# Patient Record
Sex: Female | Born: 1965
Health system: Southern US, Community
[De-identification: ages and names within clinical notes are randomized; demographics above are authoritative.]

## PROBLEM LIST (undated history)

## (undated) DIAGNOSIS — E785 Hyperlipidemia, unspecified: Secondary | ICD-10-CM

## (undated) DIAGNOSIS — E7800 Pure hypercholesterolemia, unspecified: Secondary | ICD-10-CM

## (undated) DIAGNOSIS — K219 Gastro-esophageal reflux disease without esophagitis: Secondary | ICD-10-CM

## (undated) DIAGNOSIS — H209 Unspecified iridocyclitis: Secondary | ICD-10-CM

## (undated) DIAGNOSIS — I10 Essential (primary) hypertension: Secondary | ICD-10-CM

## (undated) DIAGNOSIS — J309 Allergic rhinitis, unspecified: Secondary | ICD-10-CM

## (undated) DIAGNOSIS — M545 Low back pain: Secondary | ICD-10-CM

## (undated) DIAGNOSIS — M1990 Unspecified osteoarthritis, unspecified site: Secondary | ICD-10-CM

## (undated) DIAGNOSIS — D509 Iron deficiency anemia, unspecified: Secondary | ICD-10-CM

## (undated) DIAGNOSIS — Z1589 Genetic susceptibility to other disease: Secondary | ICD-10-CM

## (undated) DIAGNOSIS — D709 Neutropenia, unspecified: Secondary | ICD-10-CM

## (undated) DIAGNOSIS — M47819 Spondylosis without myelopathy or radiculopathy, site unspecified: Secondary | ICD-10-CM

## (undated) HISTORY — DX: Allergic rhinitis, unspecified: J30.9

## (undated) HISTORY — DX: Hyperlipidemia, unspecified: E78.5

## (undated) HISTORY — DX: Spondylosis without myelopathy or radiculopathy, site unspecified: M47.819

## (undated) HISTORY — DX: Low back pain: M54.5

## (undated) HISTORY — DX: Gastro-esophageal reflux disease without esophagitis: K21.9

## (undated) HISTORY — DX: Genetic susceptibility to other disease: Z15.89

## (undated) HISTORY — DX: Unspecified iridocyclitis: H20.9

## (undated) HISTORY — DX: Neutropenia, unspecified: D70.9

---

## 1984-11-03 HISTORY — PX: DILATION AND CURETTAGE OF UTERUS: SHX78

## 1987-11-04 HISTORY — PX: TUMOR REMOVAL: SHX12

## 2002-01-11 ENCOUNTER — Encounter: Admission: RE | Admit: 2002-01-11 | Discharge: 2002-01-11 | Payer: Self-pay | Admitting: Obstetrics and Gynecology

## 2002-01-11 ENCOUNTER — Encounter: Payer: Self-pay | Admitting: Obstetrics and Gynecology

## 2003-01-17 ENCOUNTER — Encounter: Payer: Self-pay | Admitting: Obstetrics and Gynecology

## 2003-01-17 ENCOUNTER — Encounter: Admission: RE | Admit: 2003-01-17 | Discharge: 2003-01-17 | Payer: Self-pay | Admitting: Obstetrics and Gynecology

## 2003-11-27 ENCOUNTER — Emergency Department (HOSPITAL_COMMUNITY): Admission: EM | Admit: 2003-11-27 | Discharge: 2003-11-27 | Payer: Self-pay | Admitting: Emergency Medicine

## 2004-01-31 ENCOUNTER — Encounter: Admission: RE | Admit: 2004-01-31 | Discharge: 2004-01-31 | Payer: Self-pay | Admitting: Family Medicine

## 2004-02-09 ENCOUNTER — Other Ambulatory Visit: Admission: RE | Admit: 2004-02-09 | Discharge: 2004-02-09 | Payer: Self-pay | Admitting: Family Medicine

## 2005-02-05 ENCOUNTER — Encounter: Admission: RE | Admit: 2005-02-05 | Discharge: 2005-02-05 | Payer: Self-pay | Admitting: Family Medicine

## 2005-02-10 ENCOUNTER — Other Ambulatory Visit: Admission: RE | Admit: 2005-02-10 | Discharge: 2005-02-10 | Payer: Self-pay | Admitting: Family Medicine

## 2005-02-25 ENCOUNTER — Emergency Department (HOSPITAL_COMMUNITY): Admission: EM | Admit: 2005-02-25 | Discharge: 2005-02-25 | Payer: Self-pay | Admitting: Emergency Medicine

## 2006-02-10 ENCOUNTER — Other Ambulatory Visit: Admission: RE | Admit: 2006-02-10 | Discharge: 2006-02-10 | Payer: Self-pay | Admitting: Family Medicine

## 2006-02-10 ENCOUNTER — Encounter: Admission: RE | Admit: 2006-02-10 | Discharge: 2006-02-10 | Payer: Self-pay | Admitting: Family Medicine

## 2007-02-15 ENCOUNTER — Other Ambulatory Visit: Admission: RE | Admit: 2007-02-15 | Discharge: 2007-02-15 | Payer: Self-pay | Admitting: Family Medicine

## 2007-02-15 ENCOUNTER — Encounter: Admission: RE | Admit: 2007-02-15 | Discharge: 2007-02-15 | Payer: Self-pay | Admitting: Family Medicine

## 2008-02-16 ENCOUNTER — Other Ambulatory Visit: Admission: RE | Admit: 2008-02-16 | Discharge: 2008-02-16 | Payer: Self-pay | Admitting: Family Medicine

## 2008-02-18 ENCOUNTER — Encounter: Admission: RE | Admit: 2008-02-18 | Discharge: 2008-02-18 | Payer: Self-pay | Admitting: Family Medicine

## 2009-03-13 ENCOUNTER — Emergency Department (HOSPITAL_COMMUNITY): Admission: EM | Admit: 2009-03-13 | Discharge: 2009-03-13 | Payer: Self-pay | Admitting: Emergency Medicine

## 2009-07-04 ENCOUNTER — Other Ambulatory Visit: Admission: RE | Admit: 2009-07-04 | Discharge: 2009-07-04 | Payer: Self-pay | Admitting: Family Medicine

## 2009-07-13 ENCOUNTER — Encounter: Admission: RE | Admit: 2009-07-13 | Discharge: 2009-07-13 | Payer: Self-pay | Admitting: Family Medicine

## 2009-09-22 ENCOUNTER — Emergency Department (HOSPITAL_COMMUNITY): Admission: EM | Admit: 2009-09-22 | Discharge: 2009-09-22 | Payer: Self-pay | Admitting: Emergency Medicine

## 2010-07-05 ENCOUNTER — Other Ambulatory Visit: Admission: RE | Admit: 2010-07-05 | Discharge: 2010-07-05 | Payer: Self-pay | Admitting: Family Medicine

## 2010-09-09 ENCOUNTER — Encounter: Admission: RE | Admit: 2010-09-09 | Discharge: 2010-09-09 | Payer: Self-pay | Admitting: Family Medicine

## 2010-11-23 ENCOUNTER — Emergency Department (HOSPITAL_COMMUNITY)
Admission: EM | Admit: 2010-11-23 | Discharge: 2010-11-23 | Payer: Self-pay | Source: Home / Self Care | Admitting: Family Medicine

## 2011-07-09 ENCOUNTER — Other Ambulatory Visit: Payer: Self-pay | Admitting: Physician Assistant

## 2011-07-09 ENCOUNTER — Other Ambulatory Visit (HOSPITAL_COMMUNITY)
Admission: RE | Admit: 2011-07-09 | Discharge: 2011-07-09 | Disposition: A | Discharge status: Home / Self Care | Payer: 59 | Source: Ambulatory Visit | Attending: Family Medicine | Admitting: Family Medicine

## 2011-07-09 DIAGNOSIS — Z124 Encounter for screening for malignant neoplasm of cervix: Secondary | ICD-10-CM | POA: Insufficient documentation

## 2011-10-15 ENCOUNTER — Other Ambulatory Visit: Payer: Self-pay | Admitting: Family Medicine

## 2011-10-15 DIAGNOSIS — Z1231 Encounter for screening mammogram for malignant neoplasm of breast: Secondary | ICD-10-CM

## 2011-11-11 ENCOUNTER — Ambulatory Visit: Payer: 59

## 2011-11-21 ENCOUNTER — Ambulatory Visit: Payer: 59

## 2011-12-05 ENCOUNTER — Ambulatory Visit
Admission: RE | Admit: 2011-12-05 | Discharge: 2011-12-05 | Disposition: A | Discharge status: Home / Self Care | Payer: 59 | Source: Ambulatory Visit | Attending: Family Medicine | Admitting: Family Medicine

## 2011-12-05 DIAGNOSIS — Z1231 Encounter for screening mammogram for malignant neoplasm of breast: Secondary | ICD-10-CM

## 2012-01-31 ENCOUNTER — Emergency Department (HOSPITAL_COMMUNITY)
Admission: EM | Admit: 2012-01-31 | Discharge: 2012-01-31 | Disposition: A | Discharge status: Home / Self Care | Payer: 59 | Source: Home / Self Care | Attending: Family Medicine | Admitting: Family Medicine

## 2012-01-31 ENCOUNTER — Encounter (HOSPITAL_COMMUNITY): Payer: Self-pay | Admitting: *Deleted

## 2012-01-31 DIAGNOSIS — J302 Other seasonal allergic rhinitis: Secondary | ICD-10-CM

## 2012-01-31 DIAGNOSIS — J309 Allergic rhinitis, unspecified: Secondary | ICD-10-CM

## 2012-01-31 HISTORY — DX: Unspecified osteoarthritis, unspecified site: M19.90

## 2012-01-31 HISTORY — DX: Pure hypercholesterolemia, unspecified: E78.00

## 2012-01-31 MED ORDER — METHYLPREDNISOLONE ACETATE 80 MG/ML IJ SUSP
INTRAMUSCULAR | Status: AC
  Filled 2012-01-31: qty 1

## 2012-01-31 MED ORDER — METHYLPREDNISOLONE ACETATE 40 MG/ML IJ SUSP
80.0000 mg | Freq: Once | INTRAMUSCULAR | Status: AC
  Administered 2012-01-31: 80 mg via INTRAMUSCULAR

## 2012-01-31 MED ORDER — FLUTICASONE PROPIONATE 50 MCG/ACT NA SUSP: 2.0000 | Freq: Every day | NASAL | Status: DC

## 2012-01-31 MED ORDER — CETIRIZINE HCL 10 MG PO TABS: 10.0000 mg | ORAL_TABLET | Freq: Every day | ORAL | Status: DC

## 2012-01-31 NOTE — ED Notes (Signed)
Pt with c/o cough/congestion/headache/sorethroat/chills onset Sunday - called own md Tuesday told to treat as virus - no improvement

## 2012-01-31 NOTE — ED Provider Notes (Signed)
History     CSN: [id number]  Arrival date & time 01/31/12  1026   First MD Initiated Contact with Patient 01/31/12 1029      Chief Complaint  Patient presents with  . Sore Throat  . Headache  . Chills  . Nasal Congestion  . Cough    (Consider location/radiation/quality/duration/timing/severity/associated sxs/prior treatment) Patient is a 46 y.o. female presenting with pharyngitis. The history is provided by the patient.  Sore Throat This is a new problem. The current episode started more than 1 week ago. The problem has not changed since onset.The symptoms are aggravated by swallowing.    Past Medical History  Diagnosis Date  . High cholesterol   . Arthritis     Past Surgical History  Procedure Date  . Cesarean section     History reviewed. No pertinent family history.  History  Substance Use Topics  . Smoking status: Never Smoker   . Smokeless tobacco: Not on file  . Alcohol Use: No    OB History    Grav Para Term Preterm Abortions TAB SAB Ect Mult Living                  Review of Systems  Constitutional: Negative.   HENT: Positive for congestion, sore throat, rhinorrhea, sneezing and postnasal drip.   Respiratory: Negative for cough and wheezing.   Gastrointestinal: Negative.     Allergies  Review of patient's allergies indicates no known allergies.  Home Medications   Current Outpatient Rx  Name Route Sig Dispense Refill  . ATORVASTATIN CALCIUM 20 MG PO TABS Oral Take 20 mg by mouth daily.    Marland Kitchen CALCIUM CARBONATE 200 MG PO CAPS Oral Take 250 mg by mouth 2 (two) times daily with a meal.    . GUAIFENESIN ER 600 MG PO TB12 Oral Take 1,200 mg by mouth 2 (two) times daily.    Marland Kitchen ONE-DAILY MULTI VITAMINS PO TABS Oral Take 1 tablet by mouth daily.    Marland Kitchen PRESCRIPTION MEDICATION  Lo oval    . CETIRIZINE HCL 10 MG PO TABS Oral Take 1 tablet (10 mg total) by mouth daily. One tab daily for allergies 30 tablet 1  . FLUTICASONE PROPIONATE 50 MCG/ACT NA SUSP  Nasal Place 2 sprays into the nose daily. 1 g 2    BP 147/91  Pulse 78  Temp(Src) 99.4 F (37.4 C) (Oral)  Resp 18  SpO2 98%  LMP 01/10/2012  Physical Exam  Nursing note and vitals reviewed. Constitutional: She is oriented to person, place, and time. She appears well-developed and well-nourished.  HENT:  Head: Normocephalic.  Right Ear: External ear normal.  Left Ear: External ear normal.  Nose: Mucosal edema and rhinorrhea present.  Mouth/Throat: Oropharynx is clear and moist.  Eyes: Conjunctivae are normal. Pupils are equal, round, and reactive to light.  Neck: Normal range of motion. Neck supple.  Cardiovascular: Normal rate and normal heart sounds.   Pulmonary/Chest: Breath sounds normal. She has no wheezes. She has no rales.  Lymphadenopathy:    She has no cervical adenopathy.  Neurological: She is alert and oriented to person, place, and time.  Skin: Skin is warm and dry.    ED Course  Procedures (including critical care time)  Labs Reviewed - No data to display No results found.   1. Seasonal allergic rhinitis       MDM          Linna Hoff, MD 02/02/12 1530

## 2012-07-13 ENCOUNTER — Other Ambulatory Visit (HOSPITAL_COMMUNITY)
Admission: RE | Admit: 2012-07-13 | Discharge: 2012-07-13 | Disposition: A | Discharge status: Home / Self Care | Payer: 59 | Source: Ambulatory Visit | Attending: Family Medicine | Admitting: Family Medicine

## 2012-07-13 ENCOUNTER — Other Ambulatory Visit: Payer: Self-pay | Admitting: Physician Assistant

## 2012-07-13 DIAGNOSIS — Z124 Encounter for screening for malignant neoplasm of cervix: Secondary | ICD-10-CM | POA: Insufficient documentation

## 2013-01-10 ENCOUNTER — Other Ambulatory Visit: Payer: Self-pay

## 2013-01-10 ENCOUNTER — Other Ambulatory Visit: Payer: Self-pay | Admitting: Family Medicine

## 2013-01-10 DIAGNOSIS — Z1231 Encounter for screening mammogram for malignant neoplasm of breast: Secondary | ICD-10-CM

## 2013-01-17 ENCOUNTER — Ambulatory Visit: Payer: 59

## 2013-02-08 ENCOUNTER — Encounter (HOSPITAL_COMMUNITY): Payer: Self-pay

## 2013-02-08 ENCOUNTER — Emergency Department (HOSPITAL_COMMUNITY)
Admission: EM | Admit: 2013-02-08 | Discharge: 2013-02-08 | Disposition: A | Discharge status: Home / Self Care | Payer: 59 | Attending: Emergency Medicine | Admitting: Emergency Medicine

## 2013-02-08 DIAGNOSIS — Z79899 Other long term (current) drug therapy: Secondary | ICD-10-CM | POA: Insufficient documentation

## 2013-02-08 DIAGNOSIS — M545 Low back pain, unspecified: Secondary | ICD-10-CM | POA: Insufficient documentation

## 2013-02-08 DIAGNOSIS — R110 Nausea: Secondary | ICD-10-CM | POA: Insufficient documentation

## 2013-02-08 DIAGNOSIS — E7800 Pure hypercholesterolemia, unspecified: Secondary | ICD-10-CM | POA: Insufficient documentation

## 2013-02-08 DIAGNOSIS — R109 Unspecified abdominal pain: Secondary | ICD-10-CM | POA: Insufficient documentation

## 2013-02-08 DIAGNOSIS — Z3202 Encounter for pregnancy test, result negative: Secondary | ICD-10-CM | POA: Insufficient documentation

## 2013-02-08 DIAGNOSIS — M129 Arthropathy, unspecified: Secondary | ICD-10-CM | POA: Insufficient documentation

## 2013-02-08 DIAGNOSIS — R10819 Abdominal tenderness, unspecified site: Secondary | ICD-10-CM | POA: Insufficient documentation

## 2013-02-08 DIAGNOSIS — IMO0002 Reserved for concepts with insufficient information to code with codable children: Secondary | ICD-10-CM | POA: Insufficient documentation

## 2013-02-08 LAB — LIPASE, BLOOD: Lipase: 34 U/L (ref 11–59)

## 2013-02-08 LAB — CBC WITH DIFFERENTIAL/PLATELET
Eosinophils Absolute: 0.1 10*3/uL (ref 0.0–0.7)
Eosinophils Relative: 1 % (ref 0–5)
HCT: 37.1 % (ref 36.0–46.0)
Hemoglobin: 12.1 g/dL (ref 12.0–15.0)
Lymphocytes Relative: 12 % (ref 12–46)
Lymphs Abs: 0.5 10*3/uL — ABNORMAL LOW (ref 0.7–4.0)
MCHC: 32.6 g/dL (ref 30.0–36.0)
Monocytes Relative: 13 % — ABNORMAL HIGH (ref 3–12)
Neutro Abs: 3 10*3/uL (ref 1.7–7.7)
WBC: 4.1 10*3/uL (ref 4.0–10.5)

## 2013-02-08 LAB — COMPREHENSIVE METABOLIC PANEL
ALT: 17 U/L (ref 0–35)
AST: 25 U/L (ref 0–37)
BUN: 11 mg/dL (ref 6–23)
Calcium: 9.3 mg/dL (ref 8.4–10.5)
Chloride: 104 mEq/L (ref 96–112)
GFR calc Af Amer: >90 mL/min (ref >90)
GFR calc non Af Amer: >90 mL/min (ref >90)
Total Bilirubin: 0.3 mg/dL (ref 0.3–1.2)

## 2013-02-08 LAB — URINALYSIS, ROUTINE W REFLEX MICROSCOPIC
Bilirubin Urine: NEGATIVE
Hgb urine dipstick: NEGATIVE
Protein, ur: NEGATIVE mg/dL
Urobilinogen, UA: 1 mg/dL (ref 0.0–1.0)

## 2013-02-08 LAB — POCT PREGNANCY, URINE: Preg Test, Ur: NEGATIVE

## 2013-02-08 MED ORDER — NAPROXEN 500 MG PO TABS: 500.0000 mg | ORAL_TABLET | Freq: Two times a day (BID) | ORAL | Status: DC

## 2013-02-08 MED ORDER — SODIUM CHLORIDE 0.9 % IV SOLN
Freq: Once | INTRAVENOUS | Status: AC
  Administered 2013-02-08: 08:00:00 via INTRAVENOUS

## 2013-02-08 MED ORDER — ONDANSETRON HCL 4 MG/2ML IJ SOLN
4.0000 mg | Freq: Four times a day (QID) | INTRAMUSCULAR | Status: DC | PRN
  Administered 2013-02-08: 4 mg via INTRAVENOUS
  Filled 2013-02-08: qty 2

## 2013-02-08 MED ORDER — TRAMADOL HCL 50 MG PO TABS: 50.0000 mg | ORAL_TABLET | Freq: Four times a day (QID) | ORAL | Status: DC | PRN

## 2013-02-08 MED ORDER — MORPHINE SULFATE 4 MG/ML IJ SOLN
4.0000 mg | INTRAMUSCULAR | Status: DC | PRN
Start: 2013-02-08 — End: 2013-02-08
  Administered 2013-02-08: 4 mg via INTRAVENOUS
  Filled 2013-02-08: qty 1

## 2013-02-08 NOTE — ED Notes (Signed)
Pt complains of lower abd pain since yesterday and nausea, no vomiting, no diarrhea.

## 2013-02-08 NOTE — ED Notes (Signed)
Pt escorted to discharge window. Pt verbalized understanding discharge instructions. In no acute distress.  

## 2013-02-08 NOTE — ED Notes (Signed)
md at bedside

## 2013-02-08 NOTE — ED Provider Notes (Signed)
History     CSN: [id number]  Arrival date & time 02/08/13  4098   First MD Initiated Contact with Patient 02/08/13 [phone]      Chief Complaint  Patient presents with  . Abdominal Pain  . 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 She denies any diarrhea, rectal bleeding, vomiting, cough or shortness of breath but she does have mild low back pain which has persisted over several years. The pain is located in the suprapubic area but seems to be diffuse in the mid abdomen.  She has a Mirena IUD, has recently visited with OB/GYN and had ultrasound showing an ovarian cyst which  has since resolved  Patient is a 47 y.o. female presenting with abdominal pain. The history is provided by the patient.  Abdominal Pain   Past Medical History  Diagnosis Date  . High cholesterol   . Arthritis     Past Surgical History  Procedure Laterality Date  . Cesarean section      History reviewed. No pertinent family history.  History  Substance Use Topics  . Smoking status: Never Smoker   . Smokeless tobacco: Not on file  . Alcohol Use: No    OB History   Grav Para Term Preterm Abortions TAB SAB Ect Mult Living                  Review of Systems  Gastrointestinal: Positive for abdominal pain.  All other systems reviewed and are negative.    Allergies  Contrast media  Home Medications   Current Outpatient Rx  Name  Route  Sig  Dispense  Refill  . atorvastatin (LIPITOR) 40 MG tablet   Oral   Take 40 mg by mouth every evening.         . calcium carbonate (OS-CAL - DOSED IN MG OF ELEMENTAL  CALCIUM) 1250 MG tablet   Oral   Take 1 tablet by mouth daily.         . diclofenac (VOLTAREN) 75 MG EC tablet   Oral   Take 75 mg by mouth 2 (two) times daily.         . fluticasone (FLONASE) 50 MCG/ACT nasal spray   Nasal   Place 2 sprays into the nose daily.         . Multiple Vitamin (MULTIVITAMIN) tablet   Oral   Take 1 tablet by mouth daily.         . naproxen (NAPROSYN) 500 MG tablet   Oral   Take 1 tablet (500 mg total) by mouth 2 (two) times daily with a meal.   30 tablet   0   . traMADol (ULTRAM) 50 MG tablet   Oral   Take 1 tablet (50 mg total) by mouth every 6 (six) hours as needed for pain.   15 tablet   0     BP 106/69  Pulse 62  Temp(Src) 98.1 F (36.7 C) (Oral)  Resp 16  Wt 220 lb (99.791 kg)  SpO2 97%  LMP 02/03/2013  Physical Exam  Nursing note and vitals reviewed. Constitutional: She appears well-developed and well-nourished. No distress.  HENT:  Head: Normocephalic and  atraumatic.  Mouth/Throat: Oropharynx is clear and moist. No oropharyngeal exudate.  Eyes: Conjunctivae and EOM are normal. Pupils are equal, round, and reactive to light. Right eye exhibits no discharge. Left eye exhibits no discharge. No scleral icterus.  Neck: Normal range of motion. Neck supple. No JVD present. No thyromegaly present.  Cardiovascular: Normal rate, regular rhythm, normal heart sounds and intact distal pulses.  Exam reveals no gallop and no friction rub.   No murmur heard. Pulmonary/Chest: Effort normal and breath sounds normal. No respiratory distress. She has no wheezes. She has no rales.  Abdominal: Soft. Bowel sounds are normal. She exhibits no distension and no mass. There is tenderness ( Mild tenderness in the suprapubic area, periumbilical area and epigastrium).  No pain in the right upper quadrant, no pain at McBurney's point, no guarding, no peritoneal signs, no tympanitic sounds to percussion  Musculoskeletal: Normal range of motion. She  exhibits no edema and no tenderness.  Lymphadenopathy:    She has no cervical adenopathy.  Neurological: She is alert. Coordination normal.  Speech is clear, moves all extremities x4 without difficulty  Skin: Skin is warm and dry. No rash noted. No erythema.  Psychiatric: She has a normal mood and affect. Her behavior is normal.    ED Course  Procedures (including critical care time)  Labs Reviewed  URINALYSIS, ROUTINE W REFLEX MICROSCOPIC - Abnormal; Notable for the following:    APPearance CLOUDY (*)    Leukocytes, UA SMALL (*)    All other components within normal limits  CBC WITH DIFFERENTIAL - Abnormal; Notable for the following:    MCH 25.7 (*)    Lymphs Abs 0.5 (*)    Monocytes Relative 13 (*)    All other components within normal limits  COMPREHENSIVE METABOLIC PANEL - Abnormal; Notable for the following:    Glucose, Bld 103 (*)    Albumin 3.4 (*)    All other components within normal limits  URINE MICROSCOPIC-ADD ON - Abnormal; Notable for the following:    Squamous Epithelial / LPF MANY (*)    Bacteria, UA MANY (*)    All other components within normal limits  URINE CULTURE  LIPASE, BLOOD  POCT PREGNANCY, URINE   No results found.   1. Abdominal pain       MDM  Nonspecific abdominal pain, no pain at McBurney's point or right upper quadrant, no vomiting, check urinalysis, basic labs, would consider several different etiologies but nothing specific on initial history and exam. Vital signs unremarkable, no hypotension, no fever, no tachycardia. Patient declines pain medicines or nausea medicines at this time and symptoms are absent on arrival.   Repeat abdominal exam at 9:30 AM, no abdominal tenderness, the patient states she has improved and has no symptoms at this time, lab work shows no significant etiology of the patient's symptoms, urine culture ordered as it appears contaminated. Patient informed to return should her symptoms worsen, expressed  understanding.  Meds given in ED:  Medications  morphine 4 MG/ML injection 4 mg (4 mg Intravenous Given 02/08/13 0800)  ondansetron (ZOFRAN) injection 4 mg (4 mg Intravenous Given 02/08/13 0801)  0.9 %  sodium chloride infusion ( Intravenous New Bag/Given 02/08/13 0800)    New Prescriptions   NAPROXEN (NAPROSYN) 500 MG TABLET    Take 1 tablet (500 mg total) by mouth 2 (two) times daily with a meal.   TRAMADOL (ULTRAM) 50 MG TABLET    Take 1 tablet (50 mg total) by mouth every 6 (  six) hours as needed for pain.         Vida Roller, MD 02/08/13 [phone]

## 2013-02-09 LAB — URINE CULTURE
Colony Count: NO GROWTH
Culture: NO GROWTH

## 2013-04-18 ENCOUNTER — Ambulatory Visit
Admission: RE | Admit: 2013-04-18 | Discharge: 2013-04-18 | Disposition: A | Discharge status: Home / Self Care | Payer: 59 | Source: Ambulatory Visit

## 2013-04-18 DIAGNOSIS — Z1231 Encounter for screening mammogram for malignant neoplasm of breast: Secondary | ICD-10-CM

## 2013-11-07 ENCOUNTER — Encounter (HOSPITAL_COMMUNITY): Payer: Self-pay | Admitting: Emergency Medicine

## 2013-11-07 ENCOUNTER — Emergency Department (HOSPITAL_COMMUNITY)
Admission: EM | Admit: 2013-11-07 | Discharge: 2013-11-07 | Disposition: A | Discharge status: Home / Self Care | Payer: 59 | Source: Home / Self Care | Attending: Family Medicine | Admitting: Family Medicine

## 2013-11-07 DIAGNOSIS — J329 Chronic sinusitis, unspecified: Secondary | ICD-10-CM

## 2013-11-07 MED ORDER — PREDNISONE 10 MG PO TABS: 30.0000 mg | ORAL_TABLET | Freq: Every day | ORAL | Status: DC

## 2013-11-07 MED ORDER — AMOXICILLIN-POT CLAVULANATE 875-125 MG PO TABS: 1.0000 | ORAL_TABLET | Freq: Two times a day (BID) | ORAL | Status: DC

## 2013-11-07 MED ORDER — IPRATROPIUM BROMIDE 0.06 % NA SOLN: 2.0000 | Freq: Four times a day (QID) | NASAL | Status: DC

## 2013-11-07 NOTE — Discharge Instructions (Signed)
Thank you for coming in today. 8 Augmentin twice daily for one week. Take prednisone for 5 days. Use Atrovent nasal spray. Followup with your primary care Dr. as needed. Call or go to the emergency room if you get worse, have trouble breathing, have chest pains, or palpitations.   Sinusitis Sinusitis is redness, soreness, and swelling (inflammation) of the paranasal sinuses. Paranasal sinuses are air pockets within the bones of your face (beneath the eyes, the middle of the forehead, or above the eyes). In healthy paranasal sinuses, mucus is able to drain out, and air is able to circulate through them by way of your nose. However, when your paranasal sinuses are inflamed, mucus and air can become trapped. This can allow bacteria and other germs to grow and cause infection. Sinusitis can develop quickly and last only a short time (acute) or continue over a long period (chronic). Sinusitis that lasts for more than 12 weeks is considered chronic.  CAUSES  Causes of sinusitis include:  Allergies.  Structural abnormalities, such as displacement of the cartilage that separates your nostrils (deviated septum), which can decrease the air flow through your nose and sinuses and affect sinus drainage.  Functional abnormalities, such as when the small hairs (cilia) that line your sinuses and help remove mucus do not work properly or are not present. SYMPTOMS  Symptoms of acute and chronic sinusitis are the same. The primary symptoms are pain and pressure around the affected sinuses. Other symptoms include:  Upper toothache.  Earache.  Headache.  Bad breath.  Decreased sense of smell and taste.  A cough, which worsens when you are lying flat.  Fatigue.  Fever.  Thick drainage from your nose, which often is green and may contain pus (purulent).  Swelling and warmth over the affected sinuses. DIAGNOSIS  Your caregiver will perform a physical exam. During the exam, your caregiver  may:  Look in your nose for signs of abnormal growths in your nostrils (nasal polyps).  Tap over the affected sinus to check for signs of infection.  View the inside of your sinuses (endoscopy) with a special imaging device with a light attached (endoscope), which is inserted into your sinuses. If your caregiver suspects that you have chronic sinusitis, one or more of the following tests may be recommended:  Allergy tests.  Nasal culture A sample of mucus is taken from your nose and sent to a lab and screened for bacteria.  Nasal cytology A sample of mucus is taken from your nose and examined by your caregiver to determine if your sinusitis is related to an allergy. TREATMENT  Most cases of acute sinusitis are related to a viral infection and will resolve on their own within 10 days. Sometimes medicines are prescribed to help relieve symptoms (pain medicine, decongestants, nasal steroid sprays, or saline sprays).  However, for sinusitis related to a bacterial infection, your caregiver will prescribe antibiotic medicines. These are medicines that will help kill the bacteria causing the infection.  Rarely, sinusitis is caused by a fungal infection. In theses cases, your caregiver will prescribe antifungal medicine. For some cases of chronic sinusitis, surgery is needed. Generally, these are cases in which sinusitis recurs more than 3 times per year, despite other treatments. HOME CARE INSTRUCTIONS   Drink plenty of water. Water helps thin the mucus so your sinuses can drain more easily.  Use a humidifier.  Inhale steam 3 to 4 times a day (for example, sit in the bathroom with the shower running).  Apply  a warm, moist washcloth to your face 3 to 4 times a day, or as directed by your caregiver.  Use saline nasal sprays to help moisten and clean your sinuses.  Take over-the-counter or prescription medicines for pain, discomfort, or fever only as directed by your caregiver. SEEK IMMEDIATE  MEDICAL CARE IF:  You have increasing pain or severe headaches.  You have nausea, vomiting, or drowsiness.  You have swelling around your face.  You have vision problems.  You have a stiff neck.  You have difficulty breathing. MAKE SURE YOU:   Understand these instructions.  Will watch your condition.  Will get help right away if you are not doing well or get worse. Document Released: 10/20/2005 Document Revised: 01/12/2012 Document Reviewed: 11/04/2011 [facility] Patient Information 2014 [place], [place].

## 2013-11-07 NOTE — ED Notes (Signed)
Congestion and headache that started on November 03, 2013.  -patient has been taking mucinex d and aleve.

## 2013-11-07 NOTE — ED Provider Notes (Signed)
Jennifer Webb is a 48 y.o. female who presents to Urgent Care today for 5 days of sinus congestion headache cough without any significant fevers chills nausea vomiting or diarrhea. Patient has tried some over-the-counter Mucinex D which helped a bit. She feels well otherwise. She has frequent episodes of sinusitis. Symptoms are moderate and worse with activity.   Past Medical History  Diagnosis Date  . High cholesterol   . Arthritis    History  Substance Use Topics  . Smoking status: Never Smoker   . Smokeless tobacco: Not on file  . Alcohol Use: No   ROS as above Medications reviewed. No current facility-administered medications for this encounter.   Current Outpatient Prescriptions  Medication Sig Dispense Refill  . amoxicillin-clavulanate (AUGMENTIN) 875-125 MG per tablet Take 1 tablet by mouth every 12 (twelve) hours.  14 tablet  0  . atorvastatin (LIPITOR) 40 MG tablet Take 40 mg by mouth every evening.      . calcium carbonate (OS-CAL - DOSED IN MG OF ELEMENTAL CALCIUM) 1250 MG tablet Take 1 tablet by mouth daily.      . diclofenac (VOLTAREN) 75 MG EC tablet Take 75 mg by mouth 2 (two) times daily.      . fluticasone (FLONASE) 50 MCG/ACT nasal spray Place 2 sprays into the nose daily.      Marland Kitchen ipratropium (ATROVENT) 0.06 % nasal spray Place 2 sprays into both nostrils 4 (four) times daily.  15 mL  1  . Multiple Vitamin (MULTIVITAMIN) tablet Take 1 tablet by mouth daily.      . naproxen (NAPROSYN) 500 MG tablet Take 1 tablet (500 mg total) by mouth 2 (two) times daily with a meal.  30 tablet  0  . predniSONE (DELTASONE) 10 MG tablet Take 3 tablets (30 mg total) by mouth daily.  15 tablet  0  . traMADol (ULTRAM) 50 MG tablet Take 1 tablet (50 mg total) by mouth every 6 (six) hours as needed for pain.  15 tablet  0    Exam:  BP 138/94  Pulse 72  Temp(Src) 98.1 F (36.7 C) (Oral)  Resp 16  SpO2 98%  LMP 10/23/2013 Gen: Well NAD HEENT: EOMI,  MMM mildly tender right  maxillary sinus to palpation. Tympanic membranes are normal appearing bilaterally. Posterior pharynx is with cobblestoning. Nasal turbinates are mildly inflamed bilaterally Lungs: Normal work of breathing. CTABL Heart: RRR no MRG Abd: NABS, Soft. NT, ND Exts: Non edematous BL  LE, warm and well perfused.    Assessment and Plan: 48 y.o. female with sinusitis. Plan to treat with low-dose prednisone, Augmentin, Atrovent nasal spray. Followup with primary care provider. Discussed warning signs or symptoms. Please see discharge instructions. Patient expresses understanding.      Rodolph Bong, MD 11/07/13 [phone]

## 2013-12-12 ENCOUNTER — Ambulatory Visit (INDEPENDENT_AMBULATORY_CARE_PROVIDER_SITE_OTHER): Payer: 59 | Admitting: Gynecology

## 2013-12-12 ENCOUNTER — Encounter: Payer: Self-pay | Admitting: Gynecology

## 2013-12-12 VITALS — BP 126/86 | HR 72 | Resp 16 | Ht 65.5 in | Wt 217.0 lb

## 2013-12-12 DIAGNOSIS — Z0000 Encounter for general adult medical examination without abnormal findings: Secondary | ICD-10-CM

## 2013-12-12 DIAGNOSIS — N898 Other specified noninflammatory disorders of vagina: Secondary | ICD-10-CM

## 2013-12-12 DIAGNOSIS — Z3009 Encounter for other general counseling and advice on contraception: Secondary | ICD-10-CM

## 2013-12-12 DIAGNOSIS — Z01419 Encounter for gynecological examination (general) (routine) without abnormal findings: Secondary | ICD-10-CM

## 2013-12-12 LAB — POCT URINALYSIS DIPSTICK
Leukocytes, UA: NEGATIVE
UROBILINOGEN UA: NEGATIVE
pH, UA: 5

## 2013-12-12 LAB — POCT WET PREP (WET MOUNT): CLUE CELLS WET PREP WHIFF POC: POSITIVE

## 2013-12-12 MED ORDER — TINIDAZOLE 500 MG PO TABS: 2.0000 g | ORAL_TABLET | Freq: Once | ORAL | Status: DC

## 2013-12-12 NOTE — Progress Notes (Signed)
48 y.o. divorce African American female   G2P2001 here for annual exam. Pt is currently sexually active. Pt has Mirena IUD placed 11/13,  Pt does get some form of menses on IUD, light flow for 3d usually.  Pt reports having a discharge for a while-fishy odors, yellowish, can be worse after sex.  Pt was treated for BV a few months back and did get better.  No douching. Pads only, no tampons.  Patient's last menstrual period was 11/21/2013.          Sexually active: yes  The current method of family planning is IUD.    Exercising: yes  [employer] 3x/wk  Last pap: 07/2012 Negative  Alcohol:  Occasionally  Tobacco: no BSE: yes Mammogram: 04/2013 Normal  Labs: Estill Batten  Urine: Negative     No health maintenance topics applied.  Family History  Problem Relation Age of Onset  . Diabetes Mother   . Hypertension Mother   . Lung cancer Father   . Hypertension Father   . Heart attack Maternal Aunt   . Diabetes Maternal Grandmother   . Heart attack Maternal Grandmother   . Pancreatic cancer Brother     There are no active problems to display for this patient.   Past Medical History  Diagnosis Date  . High cholesterol   . Arthritis     Past Surgical History  Procedure Laterality Date  . Cesarean section      x2  . 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 Dilation and curettage of uterus  1986    Allergies: Contrast media  Current Outpatient Prescriptions  Medication Sig Dispense Refill  . calcium carbonate (OS-CAL - DOSED IN MG OF ELEMENTAL CALCIUM) 1250 MG tablet Take 1 tablet by mouth daily.      . diclofenac (VOLTAREN) 75 MG EC tablet Take 75 mg by mouth 2 (two) times daily.      . fluticasone (FLONASE) 50 MCG/ACT nasal spray Place 2 sprays into the nose daily.      . Multiple Vitamin (MULTIVITAMIN) tablet Take 1 tablet by mouth daily.      Marland Kitchen atorvastatin (LIPITOR) 40 MG tablet Take 40 mg by mouth every evening.       No current  facility-administered medications for this visit.    ROS: Pertinent items are noted in HPI.  Exam:    BP 126/86  Pulse 72  Resp 16  Ht 5' 5.5" (1.664 m)  Wt 217 lb (98.431 kg)  BMI 35.55 kg/m2  LMP 11/21/2013 Weight change: @WEIGHTCHANGE @ Last 3 height recordings:  Ht Readings from Last 3 Encounters:  12/12/13 5' 5.5" (1.664 m)   General appearance: alert, cooperative and appears stated age Head: Normocephalic, without obvious abnormality, atraumatic Neck: no adenopathy, no carotid bruit, no JVD, supple, symmetrical, trachea midline and thyroid not enlarged, symmetric, no tenderness/mass/nodules Lungs: clear to auscultation bilaterally Breasts: normal appearance, no masses or tenderness Heart: regular rate and rhythm, S1, S2 normal, no murmur, click, rub or gallop Abdomen: soft, non-tender; bowel sounds normal; no masses,  no organomegaly Extremities: extremities normal, atraumatic, no cyanosis or edema Skin: Skin color, texture, turgor normal. No rashes or lesions Lymph nodes: Cervical, supraclavicular, and axillary nodes normal. no inguinal nodes palpated Neurologic: Grossly normal   Pelvic: External genitalia:  no lesions              Urethra: normal appearing urethra with no masses, tenderness or lesions  Bartholins and Skenes: normal                 Vagina: normal appearing vagina with normal color and discharge, no lesions, vaginal discharge - white and thin              Cervix: normal appearance and os stenotic              Pap taken: no        Bimanual Exam:  Uterus:  uterus is normal size, shape, consistency and nontender                                      Adnexa:    normal adnexa in size, nontender and no masses                                      Rectovaginal: Confirms                                      Anus:  normal sphincter tone, no lesions  A: well woman Contraceptive management with IUD Vaginal discharge     P: mammogram annually pap  smear not done-reviewed neg PAP 2010-2013, new guidelines reviewed, repeat next year, HPV not done Discussed contraceptive options-pt had been on ocp but issues with compliance.  Risks and benefits of IUD reviewed, pt assured re light flow, now she choses to keep IUD, will need cytotec IUD strings not noted, pt reports having u/s to confirm placement tindamax for BV, alcohol precautions counseled on breast self exam, mammography screening, adequate intake of calcium and vitamin D, diet and exercise return annually or prn   An After Visit Summary was printed and given to the patient.

## 2013-12-12 NOTE — Patient Instructions (Signed)

## 2014-06-15 ENCOUNTER — Other Ambulatory Visit: Payer: Self-pay

## 2014-06-15 DIAGNOSIS — Z1231 Encounter for screening mammogram for malignant neoplasm of breast: Secondary | ICD-10-CM

## 2014-06-19 ENCOUNTER — Ambulatory Visit
Admission: RE | Admit: 2014-06-19 | Discharge: 2014-06-19 | Disposition: A | Discharge status: Home / Self Care | Payer: 59 | Source: Ambulatory Visit

## 2014-06-19 ENCOUNTER — Encounter (INDEPENDENT_AMBULATORY_CARE_PROVIDER_SITE_OTHER): Payer: Self-pay

## 2014-06-19 DIAGNOSIS — Z1231 Encounter for screening mammogram for malignant neoplasm of breast: Secondary | ICD-10-CM

## 2014-07-19 ENCOUNTER — Emergency Department (HOSPITAL_COMMUNITY)
Admission: EM | Admit: 2014-07-19 | Discharge: 2014-07-19 | Disposition: A | Discharge status: Home / Self Care | Payer: 59 | Source: Home / Self Care

## 2014-07-19 ENCOUNTER — Encounter (HOSPITAL_COMMUNITY): Payer: Self-pay | Admitting: Emergency Medicine

## 2014-07-19 DIAGNOSIS — R112 Nausea with vomiting, unspecified: Secondary | ICD-10-CM

## 2014-07-19 NOTE — Discharge Instructions (Signed)

## 2014-07-19 NOTE — ED Notes (Signed)
C/o dizziness and nausea onset 1300.  She broke out into a sweat and vomited her lunch ( leftover shrimp from [facility] from Sat.).  States she had the flu shot at Dr. Dimas Millin office this AM and felt fine this morning.

## 2014-07-19 NOTE — ED Provider Notes (Signed)
CSN: [id number]     Arrival date & time 07/19/14  1720 History   None    No chief complaint on file.  (Consider location/radiation/quality/duration/timing/severity/associated sxs/prior Treatment)  HPI  The patient is a 48 year old female presenting today following ingestion of possibly old food or malaise following a flu shot this morning. Patient complains of lightheadedness with positive nausea, vomiting and diaphoresis. Denies chest pain or shortness of breath. Patient states she's feeling much better now. Complains of intermittent generalized headache. No headache or eye pain at this time. Denies diarrhea or abdominal pain and reports no visual changes or weakness.  Past Medical History  Diagnosis Date  . High cholesterol   . Arthritis    Past Surgical History  Procedure Laterality Date  . Cesarean section  1987, 1983    x 2  . 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 Dilation and curettage of uterus  1986   Family History  Problem Relation Age of Onset  . Diabetes Mother   . Hypertension Mother   . Lung cancer Father   . Hypertension Father   . Heart attack Maternal Aunt   . Diabetes Maternal Grandmother   . Heart attack Maternal Grandmother   . Pancreatic cancer Brother    History  Substance Use Topics  . Smoking status: Never Smoker   . Smokeless tobacco: Not on file  . Alcohol Use: Yes     Comment: occasional   OB History   Grav Para Term Preterm Abortions TAB SAB Ect Mult Living   2 2 2       1      Review of Systems  Constitutional: Negative.  Negative for fever.  HENT: Negative for congestion, sneezing and sore throat.   Eyes: Negative.  Negative for pain, discharge and redness.  Respiratory: Negative.  Negative for cough, choking, chest tightness, shortness of breath and wheezing.   Cardiovascular: Negative.  Negative for chest pain, palpitations and leg swelling.  Gastrointestinal: Positive for nausea and vomiting. Negative for diarrhea.   Endocrine: Negative.   Genitourinary: Negative.   Skin: Negative.   Allergic/Immunologic: Negative.   Neurological: Positive for dizziness, light-headedness and headaches. Negative for tremors, seizures, syncope, facial asymmetry, speech difficulty, weakness and numbness.  Hematological: Negative.   Psychiatric/Behavioral: Negative.     Allergies  Contrast media  Home Medications   Prior to Admission medications   Medication Sig Start Date End Date Taking? Authorizing Provider  Calcium Citrate-Vitamin D (CALCIUM CITRATE + PO) Take 600 mg by mouth daily.   Yes Historical Provider, MD  diclofenac (VOLTAREN) 75 MG EC tablet Take 75 mg by mouth 2 (two) times daily.   Yes Historical Provider, MD  Multiple Vitamin (MULTIVITAMIN) tablet Take 1 tablet by mouth daily.   Yes Historical Provider, MD  atorvastatin (LIPITOR) 40 MG tablet Take 40 mg by mouth every evening.    Historical Provider, MD  calcium carbonate (OS-CAL - DOSED IN MG OF ELEMENTAL CALCIUM) 1250 MG tablet Take 1 tablet by mouth daily.    Historical Provider, MD  fluticasone (FLONASE) 50 MCG/ACT nasal spray Place 2 sprays into the nose daily.    Historical Provider, MD  tinidazole (TINDAMAX) 500 MG tablet Take 4 tablets (2,000 mg total) by mouth once. Repeat 1d 12/12/13   02/09/14, MD   BP 144/93  Pulse 68  Temp(Src) 98 F (36.7 C) (Oral)  Resp 16  SpO2 98%  LMP 06/26/2014  Physical Exam  Nursing  note and vitals reviewed. Constitutional: She is oriented to person, place, and time. She appears well-developed and well-nourished. No distress.  HENT:  Head: Normocephalic and atraumatic.  Eyes: Conjunctivae and EOM are normal. Pupils are equal, round, and reactive to light. Right eye exhibits no discharge. Left eye exhibits no discharge. No scleral icterus.  Neck: Normal range of motion. Neck supple.  Cardiovascular: Normal rate, regular rhythm, normal heart sounds and intact distal pulses.  Exam reveals no gallop and  no friction rub.   No murmur heard. Pulmonary/Chest: Effort normal and breath sounds normal. No respiratory distress. She has no wheezes. She has no rales. She exhibits no tenderness.  Abdominal: Soft. Bowel sounds are normal. She exhibits no distension and no mass. There is no tenderness. There is no rebound and no guarding.  Lymphadenopathy:    She has no cervical adenopathy.  Neurological: She is alert and oriented to person, place, and time. She displays normal reflexes. No cranial nerve deficit. She exhibits normal muscle tone. Coordination normal.  Cranial nerves II through XII grossly intact; negative Romberg and pronator drift..  Heel toe and tandem gait intact. Patient able to squat without difficulty  Skin: Skin is warm and dry. No rash noted. She is not diaphoretic. No erythema. No pallor.    ED Course  Procedures (including critical care time) Labs Review Labs Reviewed - No data to display  Imaging Review No results found.   MDM   1. Non-intractable vomiting with nausea, vomiting of unspecified type    Patient states she is "feeling much better now".  States she was unsure if it was related to "bad shrimp" or the flu shot this morning.  Patient states she is okay to go home and rest with fluids as tolerated.  Advised to followup with primary care provider if symptoms worsen or fail to continue to improve.  The patient verbalizes understanding and agrees to plan of care.         Servando Salina, NP 07/19/14 1918

## 2014-07-20 NOTE — ED Provider Notes (Signed)
Medical screening examination/treatment/procedure(s) were performed by a resident physician or non-physician practitioner and as the supervising physician I was immediately available for consultation/collaboration.  Clementeen Graham, MD    Rodolph Bong, MD 07/20/14 [phone]

## 2014-09-04 ENCOUNTER — Encounter (HOSPITAL_COMMUNITY): Payer: Self-pay | Admitting: Emergency Medicine

## 2014-12-15 ENCOUNTER — Ambulatory Visit: Payer: 59 | Admitting: Gynecology

## 2015-02-08 ENCOUNTER — Other Ambulatory Visit: Payer: Self-pay | Admitting: Obstetrics & Gynecology

## 2015-03-09 HISTORY — PX: ESOPHAGOGASTRODUODENOSCOPY: SHX1529

## 2015-03-09 LAB — HM COLONOSCOPY

## 2015-11-21 MED FILL — HUMIRA PEN 40 MG/0.8ML PNKT: 40 | 28 days supply | Qty: 2 | Fill #1

## 2015-11-23 DIAGNOSIS — N898 Other specified noninflammatory disorders of vagina: Secondary | ICD-10-CM | POA: Diagnosis not present

## 2015-11-23 MED FILL — metroNIDAZOLE 500 MG TABS: 500 | 7 days supply | Qty: 14 | Fill #0

## 2015-12-13 DIAGNOSIS — H209 Unspecified iridocyclitis: Secondary | ICD-10-CM | POA: Diagnosis not present

## 2015-12-13 DIAGNOSIS — Z1589 Genetic susceptibility to other disease: Secondary | ICD-10-CM | POA: Diagnosis not present

## 2015-12-13 DIAGNOSIS — M4690 Unspecified inflammatory spondylopathy, site unspecified: Secondary | ICD-10-CM | POA: Diagnosis not present

## 2015-12-13 DIAGNOSIS — R5383 Other fatigue: Secondary | ICD-10-CM | POA: Diagnosis not present

## 2015-12-13 DIAGNOSIS — Z0000 Encounter for general adult medical examination without abnormal findings: Secondary | ICD-10-CM | POA: Diagnosis not present

## 2015-12-13 DIAGNOSIS — M2550 Pain in unspecified joint: Secondary | ICD-10-CM | POA: Diagnosis not present

## 2015-12-13 DIAGNOSIS — E559 Vitamin D deficiency, unspecified: Secondary | ICD-10-CM | POA: Diagnosis not present

## 2015-12-13 DIAGNOSIS — Z1389 Encounter for screening for other disorder: Secondary | ICD-10-CM | POA: Diagnosis not present

## 2015-12-13 DIAGNOSIS — R0602 Shortness of breath: Secondary | ICD-10-CM | POA: Diagnosis not present

## 2015-12-16 DIAGNOSIS — J018 Other acute sinusitis: Secondary | ICD-10-CM | POA: Diagnosis not present

## 2015-12-27 DIAGNOSIS — M545 Low back pain: Secondary | ICD-10-CM | POA: Diagnosis not present

## 2015-12-27 DIAGNOSIS — E782 Mixed hyperlipidemia: Secondary | ICD-10-CM | POA: Diagnosis not present

## 2015-12-27 DIAGNOSIS — G8929 Other chronic pain: Secondary | ICD-10-CM | POA: Diagnosis not present

## 2015-12-27 DIAGNOSIS — D649 Anemia, unspecified: Secondary | ICD-10-CM | POA: Diagnosis not present

## 2015-12-27 DIAGNOSIS — M05722 Rheumatoid arthritis with rheumatoid factor of left elbow without organ or systems involvement: Secondary | ICD-10-CM | POA: Diagnosis not present

## 2016-01-09 DIAGNOSIS — M4690 Unspecified inflammatory spondylopathy, site unspecified: Secondary | ICD-10-CM | POA: Diagnosis not present

## 2016-01-16 MED FILL — HUMIRA PEN 40 MG/0.8ML PNKT: 40 | 28 days supply | Qty: 2 | Fill #2

## 2016-01-17 MED FILL — DICLOFENAC SOD EC 75 MG TAB: 75 | 90 days supply | Qty: 180 | Fill #1

## 2016-01-24 DIAGNOSIS — D500 Iron deficiency anemia secondary to blood loss (chronic): Secondary | ICD-10-CM | POA: Diagnosis not present

## 2016-01-24 DIAGNOSIS — J018 Other acute sinusitis: Secondary | ICD-10-CM | POA: Diagnosis not present

## 2016-01-24 DIAGNOSIS — R6889 Other general symptoms and signs: Secondary | ICD-10-CM | POA: Diagnosis not present

## 2016-01-24 DIAGNOSIS — M05722 Rheumatoid arthritis with rheumatoid factor of left elbow without organ or systems involvement: Secondary | ICD-10-CM | POA: Diagnosis not present

## 2016-02-15 MED FILL — HUMIRA PEN 40 MG/0.8ML PNKT: 40 | 28 days supply | Qty: 2 | Fill #3

## 2016-02-18 DIAGNOSIS — B373 Candidiasis of vulva and vagina: Secondary | ICD-10-CM | POA: Diagnosis not present

## 2016-02-18 MED FILL — FLUCONAZOLE 150 MG TABLET: 150 | 9 days supply | Qty: 3 | Fill #0

## 2016-02-25 DIAGNOSIS — E7800 Pure hypercholesterolemia, unspecified: Secondary | ICD-10-CM | POA: Diagnosis not present

## 2016-02-25 DIAGNOSIS — N898 Other specified noninflammatory disorders of vagina: Secondary | ICD-10-CM | POA: Diagnosis not present

## 2016-02-25 DIAGNOSIS — M05722 Rheumatoid arthritis with rheumatoid factor of left elbow without organ or systems involvement: Secondary | ICD-10-CM | POA: Diagnosis not present

## 2016-02-25 DIAGNOSIS — E559 Vitamin D deficiency, unspecified: Secondary | ICD-10-CM | POA: Diagnosis not present

## 2016-02-25 MED FILL — metroNIDAZOLE 500 MG TABS: 500 | 7 days supply | Qty: 14 | Fill #0

## 2016-02-26 ENCOUNTER — Encounter (HOSPITAL_COMMUNITY): Payer: Self-pay | Admitting: *Deleted

## 2016-02-26 ENCOUNTER — Emergency Department (HOSPITAL_COMMUNITY): Payer: 59

## 2016-02-26 ENCOUNTER — Emergency Department (HOSPITAL_COMMUNITY)
Admission: EM | Admit: 2016-02-26 | Discharge: 2016-02-26 | Disposition: A | Discharge status: Home / Self Care | Payer: 59 | Attending: Emergency Medicine | Admitting: Emergency Medicine

## 2016-02-26 DIAGNOSIS — Z792 Long term (current) use of antibiotics: Secondary | ICD-10-CM | POA: Insufficient documentation

## 2016-02-26 DIAGNOSIS — Y998 Other external cause status: Secondary | ICD-10-CM | POA: Insufficient documentation

## 2016-02-26 DIAGNOSIS — M069 Rheumatoid arthritis, unspecified: Secondary | ICD-10-CM | POA: Diagnosis not present

## 2016-02-26 DIAGNOSIS — Z862 Personal history of diseases of the blood and blood-forming organs and certain disorders involving the immune mechanism: Secondary | ICD-10-CM | POA: Diagnosis not present

## 2016-02-26 DIAGNOSIS — S3992XA Unspecified injury of lower back, initial encounter: Secondary | ICD-10-CM | POA: Diagnosis not present

## 2016-02-26 DIAGNOSIS — M545 Low back pain: Secondary | ICD-10-CM | POA: Diagnosis not present

## 2016-02-26 DIAGNOSIS — S39012A Strain of muscle, fascia and tendon of lower back, initial encounter: Secondary | ICD-10-CM | POA: Diagnosis not present

## 2016-02-26 DIAGNOSIS — M5136 Other intervertebral disc degeneration, lumbar region: Secondary | ICD-10-CM | POA: Insufficient documentation

## 2016-02-26 DIAGNOSIS — Y92410 Unspecified street and highway as the place of occurrence of the external cause: Secondary | ICD-10-CM | POA: Insufficient documentation

## 2016-02-26 DIAGNOSIS — Y9389 Activity, other specified: Secondary | ICD-10-CM | POA: Diagnosis not present

## 2016-02-26 DIAGNOSIS — Z7951 Long term (current) use of inhaled steroids: Secondary | ICD-10-CM | POA: Diagnosis not present

## 2016-02-26 DIAGNOSIS — Z8639 Personal history of other endocrine, nutritional and metabolic disease: Secondary | ICD-10-CM | POA: Insufficient documentation

## 2016-02-26 DIAGNOSIS — Z79899 Other long term (current) drug therapy: Secondary | ICD-10-CM | POA: Insufficient documentation

## 2016-02-26 DIAGNOSIS — Z791 Long term (current) use of non-steroidal anti-inflammatories (NSAID): Secondary | ICD-10-CM | POA: Insufficient documentation

## 2016-02-26 HISTORY — DX: Iron deficiency anemia, unspecified: D50.9

## 2016-02-26 MED ORDER — KETOROLAC TROMETHAMINE 60 MG/2ML IM SOLN
60.0000 mg | Freq: Once | INTRAMUSCULAR | Status: AC
  Administered 2016-02-26: 60 mg via INTRAMUSCULAR
  Filled 2016-02-26: qty 2

## 2016-02-26 MED ORDER — TRAMADOL HCL 50 MG PO TABS: 50.0000 mg | ORAL_TABLET | Freq: Four times a day (QID) | ORAL | Status: DC | PRN

## 2016-02-26 MED ORDER — CYCLOBENZAPRINE HCL 10 MG PO TABS: 5.0000 mg | ORAL_TABLET | Freq: Two times a day (BID) | ORAL | Status: DC | PRN
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
## 2016-02-26 NOTE — ED Notes (Signed)
Patient was alert, oriented and stable upon discharge. RN went over AVS and patient had no further questions.  

## 2016-02-26 NOTE — ED Notes (Signed)
PA at bedside.
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
## 2016-02-27 DIAGNOSIS — M545 Low back pain: Secondary | ICD-10-CM | POA: Diagnosis not present

## 2016-02-27 MED FILL — traMADol HCL 50 MG TABS: 50 | 4 days supply | Qty: 15 | Fill #0

## 2016-02-27 MED FILL — METHYLPREDNISOLONE 4 MG TAB: 4 | 6 days supply | Qty: 21 | Fill #0

## 2016-02-27 MED FILL — CYCLOBENZAPRINE 10 MG TAB: 10 | 10 days supply | Qty: 20 | Fill #0

## 2016-03-13 DIAGNOSIS — Z1589 Genetic susceptibility to other disease: Secondary | ICD-10-CM | POA: Diagnosis not present

## 2016-03-13 DIAGNOSIS — M2550 Pain in unspecified joint: Secondary | ICD-10-CM | POA: Diagnosis not present

## 2016-03-13 DIAGNOSIS — M4690 Unspecified inflammatory spondylopathy, site unspecified: Secondary | ICD-10-CM | POA: Diagnosis not present

## 2016-03-13 DIAGNOSIS — H209 Unspecified iridocyclitis: Secondary | ICD-10-CM | POA: Diagnosis not present

## 2016-03-28 MED FILL — HUMIRA PEN 40 MG/0.8ML PNKT: 40 | 28 days supply | Qty: 2 | Fill #0

## 2016-04-08 ENCOUNTER — Encounter: Payer: 59 | Admitting: Pharmacist

## 2016-04-24 DIAGNOSIS — D509 Iron deficiency anemia, unspecified: Secondary | ICD-10-CM | POA: Diagnosis not present

## 2016-04-24 DIAGNOSIS — Z0001 Encounter for general adult medical examination with abnormal findings: Secondary | ICD-10-CM | POA: Diagnosis not present

## 2016-05-01 ENCOUNTER — Other Ambulatory Visit: Payer: Self-pay | Admitting: Internal Medicine

## 2016-05-01 DIAGNOSIS — Z0001 Encounter for general adult medical examination with abnormal findings: Secondary | ICD-10-CM | POA: Diagnosis not present

## 2016-05-01 DIAGNOSIS — E6609 Other obesity due to excess calories: Secondary | ICD-10-CM | POA: Diagnosis not present

## 2016-05-01 DIAGNOSIS — Z1231 Encounter for screening mammogram for malignant neoplasm of breast: Secondary | ICD-10-CM

## 2016-05-01 DIAGNOSIS — E785 Hyperlipidemia, unspecified: Secondary | ICD-10-CM | POA: Diagnosis not present

## 2016-05-01 DIAGNOSIS — M25562 Pain in left knee: Secondary | ICD-10-CM | POA: Diagnosis not present

## 2016-05-13 MED FILL — HUMIRA PEN 40 MG/0.8ML PNKT: 40 | 28 days supply | Qty: 2 | Fill #1

## 2016-05-14 ENCOUNTER — Ambulatory Visit
Admission: RE | Admit: 2016-05-14 | Discharge: 2016-05-14 | Disposition: A | Discharge status: Home / Self Care | Payer: 59 | Source: Ambulatory Visit | Attending: Internal Medicine | Admitting: Internal Medicine

## 2016-05-14 DIAGNOSIS — Z1231 Encounter for screening mammogram for malignant neoplasm of breast: Secondary | ICD-10-CM | POA: Diagnosis not present

## 2016-05-15 ENCOUNTER — Ambulatory Visit (HOSPITAL_BASED_OUTPATIENT_CLINIC_OR_DEPARTMENT_OTHER): Payer: 59 | Admitting: Pharmacist

## 2016-05-15 DIAGNOSIS — M069 Rheumatoid arthritis, unspecified: Secondary | ICD-10-CM

## 2016-05-15 MED ORDER — ADALIMUMAB 40 MG/0.8ML ~~LOC~~ AJKT: 40.0000 mg | AUTO-INJECTOR | SUBCUTANEOUS | Status: DC

## 2016-05-15 NOTE — Progress Notes (Signed)
   S: Patient presents today to the [facility].  Patient is currently taking Humira for rheumatoid arthritis. Patient is managed by Dr. Kathi Ludwig for this.   Adherence: denies any missed doses Rheumatoid arthritis: SubQ: 40 mg every other week (may continue methotrexate, other nonbiologic DMARDS, corticosteroids, NSAIDs, and/or analgesics); patients not taking concomitant methotrexate may increase dose to 40 mg every week  Drug-drug interactions: none  Screening: TB test: completed per patient Hepatitis: completed per patient  Monitoring: S/sx of infection: denies CBC S/sx of hypersensitivity: denies S/sx of malignancy: denies S/sx of heart failure: denies    O:     Lab Results  Component Value Date   WBC 4.1 02/08/2013   HGB 12.1 02/08/2013   HCT 37.1 02/08/2013   MCV 78.9 02/08/2013   PLT 308 02/08/2013      Chemistry      Component Value Date/Time   NA 136 02/08/2013 0800   K 4.0 02/08/2013 0800   CL 104 02/08/2013 0800   CO2 24 02/08/2013 0800   BUN 11 02/08/2013 0800   CREATININE 0.71 02/08/2013 0800      Component Value Date/Time   CALCIUM 9.3 02/08/2013 0800   ALKPHOS 69 02/08/2013 0800   AST 25 02/08/2013 0800   ALT 17 02/08/2013 0800   BILITOT 0.3 02/08/2013 0800     Reviewed 04/2016 labs per KPN: WNL  A/P: 1. Medication review: patient on Humira for RA and tolerating it well with no adverse effects and improved control of RA. Reviewed the medication with her, including the following: Humira is a TNF blocking agent indicated for ankylosing spondylitis, Crohn's disease, Hidradenitis suppurativa, psoriatic arthritis, plaque psoriasis, ulcerative colitis, and uveitis. The most common adverse effects are infections, headache, and injection site reactions. There is the possibility of an increased risk of malignancy but it is not well understood if this increased risk is due to there medication or the disease  state. There are rare cases of pancytopenia and aplastic anemia. No recommendations for changes.    Aadvika Craver, PharmD, BCPS, CPP Clinical Pharmacist Practitioner  [facility] [phone]

## 2016-05-22 DIAGNOSIS — H209 Unspecified iridocyclitis: Secondary | ICD-10-CM | POA: Diagnosis not present

## 2016-05-22 DIAGNOSIS — Z1589 Genetic susceptibility to other disease: Secondary | ICD-10-CM | POA: Diagnosis not present

## 2016-05-22 DIAGNOSIS — M2550 Pain in unspecified joint: Secondary | ICD-10-CM | POA: Diagnosis not present

## 2016-05-22 DIAGNOSIS — M4690 Unspecified inflammatory spondylopathy, site unspecified: Secondary | ICD-10-CM | POA: Diagnosis not present

## 2016-05-28 DIAGNOSIS — Z1589 Genetic susceptibility to other disease: Secondary | ICD-10-CM | POA: Diagnosis not present

## 2016-05-28 DIAGNOSIS — H209 Unspecified iridocyclitis: Secondary | ICD-10-CM | POA: Diagnosis not present

## 2016-05-28 DIAGNOSIS — M4690 Unspecified inflammatory spondylopathy, site unspecified: Secondary | ICD-10-CM | POA: Diagnosis not present

## 2016-05-28 DIAGNOSIS — M25561 Pain in right knee: Secondary | ICD-10-CM | POA: Diagnosis not present

## 2016-05-28 DIAGNOSIS — M2550 Pain in unspecified joint: Secondary | ICD-10-CM | POA: Diagnosis not present

## 2016-06-10 MED FILL — HUMIRA PEN 40 MG/0.8ML PNKT: 40 | 28 days supply | Qty: 2 | Fill #2

## 2016-06-18 DIAGNOSIS — H524 Presbyopia: Secondary | ICD-10-CM | POA: Diagnosis not present

## 2016-07-10 DIAGNOSIS — Z79899 Other long term (current) drug therapy: Secondary | ICD-10-CM | POA: Diagnosis not present

## 2016-07-10 DIAGNOSIS — M4690 Unspecified inflammatory spondylopathy, site unspecified: Secondary | ICD-10-CM | POA: Diagnosis not present

## 2016-07-10 DIAGNOSIS — Z1589 Genetic susceptibility to other disease: Secondary | ICD-10-CM | POA: Diagnosis not present

## 2016-07-10 DIAGNOSIS — H209 Unspecified iridocyclitis: Secondary | ICD-10-CM | POA: Diagnosis not present

## 2016-07-17 MED FILL — HUMIRA PEN 40 MG/0.8ML PNKT: 40 | 28 days supply | Qty: 2 | Fill #0
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
## 2016-08-20 MED FILL — DICLOFENAC SOD 75 MG TAB EC: 75 | 90 days supply | Qty: 180 | Fill #0

## 2016-08-20 MED FILL — HUMIRA PEN 40 MG/0.8ML PNKT: 40 | 28 days supply | Qty: 2 | Fill #1

## 2016-08-25 DIAGNOSIS — J329 Chronic sinusitis, unspecified: Secondary | ICD-10-CM | POA: Diagnosis not present

## 2016-08-25 MED FILL — AZITHROMYCIN 250 MG TABLET: 250 | 5 days supply | Qty: 6 | Fill #0

## 2016-09-19 MED FILL — HUMIRA PEN 40 MG/0.8ML PNKT: 40 | 28 days supply | Qty: 2 | Fill #2

## 2016-10-09 DIAGNOSIS — Z1589 Genetic susceptibility to other disease: Secondary | ICD-10-CM | POA: Diagnosis not present

## 2016-10-09 DIAGNOSIS — M4690 Unspecified inflammatory spondylopathy, site unspecified: Secondary | ICD-10-CM | POA: Diagnosis not present

## 2016-10-09 DIAGNOSIS — Z79899 Other long term (current) drug therapy: Secondary | ICD-10-CM | POA: Diagnosis not present

## 2016-10-09 DIAGNOSIS — H209 Unspecified iridocyclitis: Secondary | ICD-10-CM | POA: Diagnosis not present

## 2016-10-17 MED FILL — HUMIRA PEN 40 MG/0.8ML PNKT: 40 | 28 days supply | Qty: 2 | Fill #3

## 2016-11-07 MED FILL — levoFLOXacin 500 MG TABS: 500 | 7 days supply | Qty: 7 | Fill #0

## 2016-11-24 MED FILL — HUMIRA PEN 40 MG/0.8ML PNKT: 40 | 28 days supply | Qty: 2 | Fill #4

## 2016-11-26 ENCOUNTER — Encounter: Payer: Self-pay | Admitting: Podiatry

## 2016-11-26 ENCOUNTER — Ambulatory Visit (INDEPENDENT_AMBULATORY_CARE_PROVIDER_SITE_OTHER): Payer: 59 | Admitting: Podiatry

## 2016-11-26 VITALS — BP 138/92 | HR 61

## 2016-11-26 DIAGNOSIS — L84 Corns and callosities: Secondary | ICD-10-CM | POA: Diagnosis not present

## 2016-11-26 DIAGNOSIS — B353 Tinea pedis: Secondary | ICD-10-CM

## 2016-11-26 MED ORDER — TERBINAFINE HCL 250 MG PO TABS: 250.0000 mg | ORAL_TABLET | Freq: Every day | ORAL | 0 refills | Status: DC

## 2016-11-26 MED FILL — TERBINAFINE HCL 250 MG TAB: 250 | 28 days supply | Qty: 28 | Fill #0

## 2016-11-26 NOTE — Progress Notes (Signed)
   Subjective:  51 year old female presents today for evaluation of a blister with cracking between her fourth and fifth digits of the right foot. Patient also states that he gets very itchy as well. Patient's been dealing with this off and on for several months. Patient states that it'll get better and then it comes back. Patient presents today for further treatment and evaluation    Objective/Physical Exam General: The patient is alert and oriented x3 in no acute distress.  Dermatology: Soft callus formation noted to the fourth interdigital webspace of the right foot between the fourth and fifth digits consistent with a heloma molle. Interdigital webspace is macerated.  Vascular: Palpable pedal pulses bilaterally. No edema or erythema noted. Capillary refill within normal limits.  Neurological: Epicritic and protective threshold grossly intact bilaterally.   Musculoskeletal Exam: Range of motion within normal limits to all pedal and ankle joints bilateral. Muscle strength 5/5 in all groups bilateral.    Assessment: #1 heloma molle fourth interdigital webspace right foot #2 tinea pedis right foot #3 pruritus of skin right foot   Plan of Care:  #1 Patient was evaluated. #2 excisional debridement of the soft [place] callus lesion was performed using a tissue nipper and curette. #3 Castellani paint applied #4 recommend daily application of Castellani paint or iodine #5 prescription for terbinafine 250 mg #28 for tinea pedis #6 return to clinic in 4 weeks   Felecia Shelling, DPM [facility]  Dr. Felecia Shelling, DPM    [address]                                        [place], [place] [address]                Office [phone]  Fax [phone]

## 2016-12-12 ENCOUNTER — Other Ambulatory Visit: Payer: Self-pay | Admitting: Obstetrics & Gynecology

## 2016-12-12 DIAGNOSIS — Z01419 Encounter for gynecological examination (general) (routine) without abnormal findings: Secondary | ICD-10-CM | POA: Diagnosis not present

## 2016-12-12 DIAGNOSIS — Z124 Encounter for screening for malignant neoplasm of cervix: Secondary | ICD-10-CM | POA: Diagnosis not present

## 2016-12-12 DIAGNOSIS — Z113 Encounter for screening for infections with a predominantly sexual mode of transmission: Secondary | ICD-10-CM | POA: Diagnosis not present

## 2016-12-12 MED FILL — PARoxetine HCL 10 MG TABS: 10 | 30 days supply | Qty: 30 | Fill #0

## 2016-12-16 LAB — CYTOLOGY - PAP

## 2016-12-24 ENCOUNTER — Ambulatory Visit: Payer: 59 | Admitting: Podiatry

## 2016-12-25 MED FILL — HUMIRA PEN 40 MG/0.8ML PNKT: 40 | 28 days supply | Qty: 2 | Fill #5

## 2017-01-07 DIAGNOSIS — H209 Unspecified iridocyclitis: Secondary | ICD-10-CM | POA: Diagnosis not present

## 2017-01-07 DIAGNOSIS — M25562 Pain in left knee: Secondary | ICD-10-CM | POA: Diagnosis not present

## 2017-01-07 DIAGNOSIS — Z79899 Other long term (current) drug therapy: Secondary | ICD-10-CM | POA: Diagnosis not present

## 2017-01-07 DIAGNOSIS — Z1589 Genetic susceptibility to other disease: Secondary | ICD-10-CM | POA: Diagnosis not present

## 2017-01-07 DIAGNOSIS — M4690 Unspecified inflammatory spondylopathy, site unspecified: Secondary | ICD-10-CM | POA: Diagnosis not present

## 2017-01-29 MED FILL — HUMIRA PEN 40 MG/0.8ML PNKT: 40 | 28 days supply | Qty: 2 | Fill #6

## 2017-02-19 MED FILL — DENTA 5000 PLUS CREAM: 1.1 | 30 days supply | Qty: 51 | Fill #0

## 2017-03-19 MED FILL — HUMIRA PEN 40 MG/0.8ML PNKT: 40 | 28 days supply | Qty: 2 | Fill #7

## 2017-03-31 MED FILL — DICLOFENAC SOD 75 MG TAB EC: 75 | 90 days supply | Qty: 180 | Fill #1

## 2017-04-17 DIAGNOSIS — H209 Unspecified iridocyclitis: Secondary | ICD-10-CM | POA: Diagnosis not present

## 2017-04-17 DIAGNOSIS — Z79899 Other long term (current) drug therapy: Secondary | ICD-10-CM | POA: Diagnosis not present

## 2017-04-17 DIAGNOSIS — Z1589 Genetic susceptibility to other disease: Secondary | ICD-10-CM | POA: Diagnosis not present

## 2017-04-17 DIAGNOSIS — M1990 Unspecified osteoarthritis, unspecified site: Secondary | ICD-10-CM | POA: Diagnosis not present

## 2017-04-17 DIAGNOSIS — Z23 Encounter for immunization: Secondary | ICD-10-CM | POA: Diagnosis not present

## 2017-04-30 MED FILL — HUMIRA PEN 40 MG/0.8ML PNKT: 40 | 28 days supply | Qty: 2 | Fill #8

## 2017-05-14 DIAGNOSIS — Z79899 Other long term (current) drug therapy: Secondary | ICD-10-CM | POA: Diagnosis not present

## 2017-05-14 DIAGNOSIS — E785 Hyperlipidemia, unspecified: Secondary | ICD-10-CM | POA: Diagnosis not present

## 2017-05-14 DIAGNOSIS — Z0000 Encounter for general adult medical examination without abnormal findings: Secondary | ICD-10-CM | POA: Diagnosis not present

## 2017-05-21 DIAGNOSIS — Z0000 Encounter for general adult medical examination without abnormal findings: Secondary | ICD-10-CM | POA: Diagnosis not present

## 2017-05-21 DIAGNOSIS — E7800 Pure hypercholesterolemia, unspecified: Secondary | ICD-10-CM | POA: Diagnosis not present

## 2017-05-29 ENCOUNTER — Other Ambulatory Visit: Payer: Self-pay | Admitting: Pharmacist

## 2017-05-29 MED ORDER — ADALIMUMAB 40 MG/0.8ML ~~LOC~~ PSKT: 0.8000 mL | PREFILLED_SYRINGE | SUBCUTANEOUS | 2 refills | Status: DC

## 2017-06-05 MED FILL — HUMIRA 40 MG/0.8ML PSKT: 40 | 28 days supply | Qty: 2 | Fill #0

## 2017-06-10 ENCOUNTER — Ambulatory Visit (INDEPENDENT_AMBULATORY_CARE_PROVIDER_SITE_OTHER): Payer: 59 | Admitting: Podiatry

## 2017-06-10 DIAGNOSIS — L601 Onycholysis: Secondary | ICD-10-CM

## 2017-06-16 NOTE — Progress Notes (Signed)
   HPI: 51 year old female presents the office today for a new complaint regarding a nail problems of the left great toe. Patient states that after wearing new shoes her toenail fell off approximately one month ago. She would like to have it checked.   Physical Exam: General: The patient is alert and oriented x3 in no acute distress.  Dermatology: There is some partial detachment noted to the left great S toenail. There is not appear to be any infectious process or erythema surrounding the nail folds. Appears very stable. Skin is warm, dry and supple bilateral lower extremities. Negative for open lesions or macerations.  Vascular: Palpable pedal pulses bilaterally. No edema or erythema noted. Capillary refill within normal limits.  Neurological: Epicritic and protective threshold grossly intact bilaterally.   Musculoskeletal Exam: Range of motion within normal limits to all pedal and ankle joints bilateral. Muscle strength 5/5 in all groups bilateral.   Radiographic Exam:  Normal osseous mineralization. Joint spaces preserved. No fracture/dislocation/boney destruction.    Assessment: 1.  Onycholysis left great toe secondary to shoe gear   Plan of Care:  1. Patient was evaluated. 2.  Today recommended the patient be very selective and shoe gear. Ensure that shoe gear does not irritate the left great toenail. 3. Recommend biotin nail supplement and nail softener 4. Return to clinic when necessary  Felecia Shelling, DPM [facility]  Dr. Felecia Shelling, DPM    [address] [place], [place] [address]                Office [phone]  Fax [phone]

## 2017-06-18 ENCOUNTER — Other Ambulatory Visit: Payer: Self-pay | Admitting: Internal Medicine

## 2017-06-18 DIAGNOSIS — Z1231 Encounter for screening mammogram for malignant neoplasm of breast: Secondary | ICD-10-CM

## 2017-06-19 DIAGNOSIS — M5441 Lumbago with sciatica, right side: Secondary | ICD-10-CM | POA: Diagnosis not present

## 2017-06-19 DIAGNOSIS — M545 Low back pain: Secondary | ICD-10-CM | POA: Diagnosis not present

## 2017-06-19 MED FILL — CYCLOBENZAPRINE 10 MG TAB: 10 | 10 days supply | Qty: 30 | Fill #0

## 2017-06-19 MED FILL — traMADol HCL 50 MG TABS: 50 | 5 days supply | Qty: 20 | Fill #0

## 2017-06-24 DIAGNOSIS — M79604 Pain in right leg: Secondary | ICD-10-CM | POA: Diagnosis not present

## 2017-06-24 DIAGNOSIS — M545 Low back pain: Secondary | ICD-10-CM | POA: Diagnosis not present

## 2017-06-24 DIAGNOSIS — M6281 Muscle weakness (generalized): Secondary | ICD-10-CM | POA: Diagnosis not present

## 2017-06-27 DIAGNOSIS — J069 Acute upper respiratory infection, unspecified: Secondary | ICD-10-CM | POA: Diagnosis not present

## 2017-06-30 ENCOUNTER — Ambulatory Visit: Payer: 59

## 2017-06-30 DIAGNOSIS — M545 Low back pain: Secondary | ICD-10-CM | POA: Diagnosis not present

## 2017-06-30 DIAGNOSIS — M6281 Muscle weakness (generalized): Secondary | ICD-10-CM | POA: Diagnosis not present

## 2017-06-30 DIAGNOSIS — M79604 Pain in right leg: Secondary | ICD-10-CM | POA: Diagnosis not present

## 2017-07-01 DIAGNOSIS — M545 Low back pain: Secondary | ICD-10-CM | POA: Diagnosis not present

## 2017-07-01 MED FILL — METHYLPREDNISOLONE 4 MG TAB: 4 | 6 days supply | Qty: 21 | Fill #0

## 2017-07-02 DIAGNOSIS — M6281 Muscle weakness (generalized): Secondary | ICD-10-CM | POA: Diagnosis not present

## 2017-07-02 DIAGNOSIS — M79604 Pain in right leg: Secondary | ICD-10-CM | POA: Diagnosis not present

## 2017-07-02 DIAGNOSIS — M545 Low back pain: Secondary | ICD-10-CM | POA: Diagnosis not present

## 2017-07-13 ENCOUNTER — Ambulatory Visit
Admission: RE | Admit: 2017-07-13 | Discharge: 2017-07-13 | Disposition: A | Discharge status: Home / Self Care | Payer: 59 | Source: Ambulatory Visit | Attending: Internal Medicine | Admitting: Internal Medicine

## 2017-07-13 DIAGNOSIS — Z1231 Encounter for screening mammogram for malignant neoplasm of breast: Secondary | ICD-10-CM

## 2017-07-13 MED FILL — HUMIRA 40 MG/0.8ML PSKT: 40 | 28 days supply | Qty: 2 | Fill #1

## 2017-07-15 DIAGNOSIS — M545 Low back pain: Secondary | ICD-10-CM | POA: Diagnosis not present

## 2017-07-21 DIAGNOSIS — M545 Low back pain: Secondary | ICD-10-CM | POA: Diagnosis not present

## 2017-07-28 DIAGNOSIS — M545 Low back pain: Secondary | ICD-10-CM | POA: Diagnosis not present

## 2017-07-28 DIAGNOSIS — M5127 Other intervertebral disc displacement, lumbosacral region: Secondary | ICD-10-CM | POA: Diagnosis not present

## 2017-07-30 ENCOUNTER — Ambulatory Visit: Payer: 59 | Attending: Physical Medicine and Rehabilitation | Admitting: Physical Therapy

## 2017-07-30 DIAGNOSIS — G8929 Other chronic pain: Secondary | ICD-10-CM | POA: Diagnosis not present

## 2017-07-30 DIAGNOSIS — R293 Abnormal posture: Secondary | ICD-10-CM | POA: Insufficient documentation

## 2017-07-30 DIAGNOSIS — M545 Low back pain: Secondary | ICD-10-CM | POA: Diagnosis not present

## 2017-07-30 DIAGNOSIS — M62830 Muscle spasm of back: Secondary | ICD-10-CM | POA: Diagnosis not present

## 2017-07-31 ENCOUNTER — Encounter: Payer: Self-pay | Admitting: Physical Therapy

## 2017-07-31 NOTE — Therapy (Signed)
[facility] Outpatient Rehabilitation [facility] [address] [place], [place], [address] Phone: [phone]   Fax:  [phone]  Physical Therapy Evaluation  Patient Details  Name: Jennifer Webb MRN: [id number] Date of Birth: [birth year] Referring Provider: Dr Romero Belling   Encounter Date: 07/30/2017      PT End of Session - 07/30/17 1639    Visit Number 1   Number of Visits 16   Date for PT Re-Evaluation 09/25/17   PT Start Time 1630   PT Stop Time 1714   PT Time Calculation (min) 44 min   Activity Tolerance Patient tolerated treatment well   Behavior During Therapy [facility] for tasks assessed/performed      Past Medical History:  Diagnosis Date  . Arthritis    Rheumatiod  . High cholesterol   . Iron deficiency anemia     Past Surgical History:  Procedure Laterality Date  . CESAREAN SECTION  1987, 1983   x 2  . DILATION AND CURETTAGE OF UTERUS  1986  . 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       Subjective Assessment - 07/30/17 1625    Subjective Patient has a history of low back pain but the pain increased to the point were she is having difficulty standing. She has pain with all prolonged positioning. Her pain has been increasing to the point were it is effecting her ability to work.    Limitations Walking   Patient Stated Goals Patient will have less pain in her back    Currently in Pain? Yes   Pain Score 5   Pain can reach a 9/10    Pain Location Back   Pain Orientation Lower   Pain Descriptors / Indicators Aching   Pain Type Acute pain   Pain Onset More than a month ago   Pain Frequency Constant   Aggravating Factors  any prolonged positioning    Pain Relieving Factors pain medication    Effect of Pain on Daily Activities difficulty perfroming ADL's    Multiple Pain Sites No            [facility] PT Assessment - 07/31/17 0001      Assessment   Medical Diagnosis Low back pain     Referring Provider Dr Romero Belling    Onset Date/Surgical Date --  For years but recent exacerbation since August    Hand Dominance Right   Next MD Visit --  4 weeks    Prior Therapy Going to PT but could not afford. 4 visits complete without much help      Precautions   Precautions None     Restrictions   Weight Bearing Restrictions No     Balance Screen   Has the patient fallen in the past 6 months No   Has the patient had a decrease in activity level because of a fear of falling?  No   Is the patient reluctant to leave their home because of a fear of falling?  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    Leisure Nothing at this time      Cognition   Overall Cognitive Status Within Functional Limits for tasks assessed  Attention Focused   Focused Attention Appears intact   Memory Appears intact   Awareness Appears intact   Problem Solving Appears intact     Observation/Other Assessments   Observations Sits leaning to the left; frewquently has to shift    Focus on Therapeutic Outcomes (FOTO)  49% limitation      Sensation   Light Touch Appears Intact   Additional Comments denies parathesias      Coordination   Gross Motor Movements are Fluid and Coordinated Yes   Fine Motor Movements are Fluid and Coordinated Yes     Posture/Postural Control   Posture Comments Rounded shoulders; forward head      ROM / Strength   AROM / PROM / Strength PROM;AROM;Strength     AROM   AROM Assessment Site Lumbar   Lumbar Flexion 50% limited    Lumbar Extension no limit    Lumbar - Right Side Bend pain to the right    Lumbar - Left Side Bend No pain    Lumbar - Right Rotation Pain to the right    Lumbar - Left Rotation No pain      PROM   Overall PROM Comments passive hip flexion to 90  degrees      Strength   Strength Assessment Site Hip;Knee   Right/Left Hip Right;Left   Right Hip Flexion 4+/5   Right Hip Extension 3+/5   Right Hip ABduction 5/5   Right Hip ADduction 5/5   Left Hip Flexion 5/5   Left Hip Extension 5/5   Left Hip ABduction 5/5   Left Hip ADduction 5/5   Right/Left Knee Right;Left   Right Knee Flexion 5/5   Right Knee Extension 5/5   Left Knee Flexion 5/5   Left Knee Extension 5/5     Palpation   Palpation comment spasmin R > L      Special Tests    Special Tests --  SLR (+) on the right      Ambulation/Gait   Gait Comments decreased hip flexion bilateral; decreased striade length             Objective measurements completed on examination: See above findings.          [facility] Adult PT Treatment/Exercise - 07/31/17 0001      Lumbar Exercises: Stretches   Passive Hamstring Stretch Limitations seated 2x20 sec    Single Knee to Chest Stretch Limitations right 2x30 sec    Lower Trunk Rotation Limitations x10    Piriformis Stretch Limitations seated for work 2x20 sec hold      Lumbar Exercises: Supine   Other Supine Lumbar Exercises posterior pelvic tilt 2x10                PT Education - 07/30/17 1637    Education provided Yes   Person(s) Educated Patient   Methods Explanation;Demonstration;Tactile cues;Verbal cues;Handout   Comprehension Verbalized understanding;Returned demonstration;Verbal cues required;Tactile cues required;Need further instruction          PT Short Term Goals - 07/31/17 0956      PT SHORT TERM GOAL #1   Title Patient will demostrate a proper posterior pelvic tilt    Time 4   Period Weeks   Status New     PT SHORT TERM GOAL #2   Title Patient will demsotrate full pain free passive hip mobility    Time 4   Period Weeks   Status New     PT SHORT TERM GOAL #3  Title Patient will be independent with initial HEP    Time 4   Period Weeks   Status New           PT Long Term  Goals - 07/31/17 1638      PT LONG TERM GOAL #1   Title Patient will stand a twork for 45 minutes without self report of increased pain    Time 8   Period Weeks   Status New     PT LONG TERM GOAL #2   Title Patient will sit for 1/2 hour without increased pain in order to drive    Time 8   Period Weeks   Status New     PT LONG TERM GOAL #3   Title Patient will demsotrate a 38% limitation on FOTO    Time 8   Period Weeks   Status New     PT LONG TERM GOAL #4   Title Patient will demsotrate 5/5 gross bilateral lower extremity strength in order to imporve ability ot perfrom daily tasks    Time 8   Period Weeks   Status New                Plan - 07/31/17 0846    Clinical Impression Statement Patient is a 62 yar old female that presentswith lower back pain that increase with prolonged positioning. She is hyper lordotic which is likely contributing to and exacerbating her anteriolethisas when she stands. She has core weakness. She has decrease mobility in her right hip. she would beneift from a program that emohasizes core stability. She would also benefit from a review of lifting technique for work. She was seen for a low complexity eval.    Clinical Presentation Evolving   Clinical Decision Making Moderate   Rehab Potential Good   PT Frequency 2x / week   PT Duration 8 weeks   PT Treatment/Interventions ADLs/Self Care Home Management;Cryotherapy;Electrical Stimulation;Iontophoresis 4mg /ml Dexamethasone;Gait training;Moist Heat;Traction;Therapeutic activities;Therapeutic exercise;Neuromuscular re-education;Splinting;Taping;Dry needling;Passive range of motion;Manual techniques;Patient/family education   PT Next Visit Plan begin light core strengthening with emphasis on PPT exercises. Consdier LAD; May benefit form manual therapy. Review lifting technique at some point   PT Home Exercise Plan PPT, LTR, sreated hamstring stretch; seted pirfirformis stretch    Consulted and Agree  with Plan of Care Patient      Patient will benefit from skilled therapeutic intervention in order to improve the following deficits and impairments:     Visit Diagnosis: Chronic bilateral low back pain without sciatica - Plan: PT plan of care cert/re-cert  Muscle spasm of back - Plan: PT plan of care cert/re-cert  Abnormal posture - Plan: PT plan of care cert/re-cert     Problem List There are no active problems to display for this patient.   PT DPT  07/31/2017, 10:14 AM  [facility] [address] [place], [place], [place] Phone: [phone]   Fax:  [phone]  Name: Jennifer Webb MRN: Mariel Aloe Date of Birth: 05-Feb-1966

## 2017-08-03 ENCOUNTER — Encounter: Payer: Self-pay | Admitting: Physical Therapy

## 2017-08-03 ENCOUNTER — Ambulatory Visit: Payer: 59 | Attending: Physical Medicine and Rehabilitation | Admitting: Physical Therapy

## 2017-08-03 DIAGNOSIS — M545 Low back pain: Secondary | ICD-10-CM | POA: Diagnosis not present

## 2017-08-03 DIAGNOSIS — G8929 Other chronic pain: Secondary | ICD-10-CM | POA: Insufficient documentation

## 2017-08-03 DIAGNOSIS — R293 Abnormal posture: Secondary | ICD-10-CM | POA: Diagnosis not present

## 2017-08-03 DIAGNOSIS — M62830 Muscle spasm of back: Secondary | ICD-10-CM | POA: Diagnosis not present

## 2017-08-03 NOTE — Therapy (Signed)
[facility] Outpatient Rehabilitation [facility] [address] [place], [place], [address] Phone: [phone]   Fax:  [phone]  Physical Therapy Treatment  Patient Details  Name: Jennifer Webb MRN: [id number] Date of Birth: 1966/09/27 Referring Provider: Dr Jennifer Webb   Encounter Date: 08/03/2017      PT End of Session - 08/03/17 1415    Visit Number 2   Number of Visits 16   Date for PT Re-Evaluation 09/25/17   PT Start Time 1330   PT Stop Time 1420   PT Time Calculation (min) 50 min   Activity Tolerance Patient tolerated treatment well   Behavior During Therapy [facility] for tasks assessed/performed      Past Medical History:  Diagnosis Date  . Arthritis    Rheumatiod  . High cholesterol   . Iron deficiency anemia     Past Surgical History:  Procedure Laterality Date  . CESAREAN SECTION  1987, 1983   x 2  . DILATION AND CURETTAGE OF UTERUS  1986  . 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      Subjective Assessment - 08/03/17 1342    Subjective Mild pain constant.   Pain Score 5     up to 8/10   Pain Location Back   Pain Orientation Lower   Pain Descriptors / Indicators Aching;Burning  sticking   Pain Type Acute pain   Pain Frequency Constant   Aggravating Factors  sitting and standing longer   Pain Relieving Factors pain meds,  change of position. muscle relaxers.    Effect of Pain on Daily Activities difficulty ADL's,  sleeping,   not yet back to walking.    Multiple Pain Sites No                         [facility] Adult PT Treatment/Exercise - 08/03/17 0001      Lumbar Exercises: Stretches   Passive Hamstring Stretch 3 reps;30 seconds   Single Knee to Chest Stretch Limitations 3 x 30,  each   Lower Trunk Rotation Limitations 10 x    Piriformis Stretch Limitations supine and sitting 3 x 30     Lumbar Exercises: Aerobic   Stationary Bike L2 X 1 mile.     Lumbar  Exercises: Supine   Clam Limitations cues 3 sets of 5 x single each and both.   Other Supine Lumbar Exercises 10 X cued moderate , able to do corectly.     Modalities   Modalities --  heat groin right,  low back during, concurrent to supine exe                  PT Short Term Goals - 07/31/17 0956      PT SHORT TERM GOAL #1   Title Patient will demostrate a proper posterior pelvic tilt    Time 4   Period Weeks   Status New     PT SHORT TERM GOAL #2   Title Patient will demsotrate full pain free passive hip mobility    Time 4   Period Weeks   Status New     PT SHORT TERM GOAL #3   Title Patient will be independent with initial HEP    Time 4   Period Weeks   Status New           PT Long Term Goals - 07/31/17 7673  PT LONG TERM GOAL #1   Title Patient will stand a twork for 45 minutes without self report of increased pain    Time 8   Period Weeks   Status New     PT LONG TERM GOAL #2   Title Patient will sit for 1/2 hour without increased pain in order to drive    Time 8   Period Weeks   Status New     PT LONG TERM GOAL #3   Title Patient will demsotrate a 38% limitation on FOTO    Time 8   Period Weeks   Status New     PT LONG TERM GOAL #4   Title Patient will demsotrate 5/5 gross bilateral lower extremity strength in order to imporve ability ot perfrom daily tasks    Time 8   Period Weeks   Status New               Plan - 08/03/17 1416    Clinical Impression Statement Paoin improving.  pain 3-4/10  ost session.  Patient to try to ride recumbant bike at work . Heat helpful.   PT Next Visit Plan begin light core strengthening with emphasis on PPT exercises. Consdier LAD; May benefit form manual therapy. Review lifting technique at some point   PT Home Exercise Plan PPT, LTR, sreated hamstring stretch; seted pirfirformis ,stretch.  Recumbant   Consulted and Agree with Plan of Care Patient      Patient will benefit from skilled  therapeutic intervention in order to improve the following deficits and impairments:     Visit Diagnosis: Chronic bilateral low back pain without sciatica  Muscle spasm of back  Abnormal posture     Problem List There are no active problems to display for this patient.   Jennifer Webb PTA 08/03/2017, 2:18 PM  [facility] [address] [place], [place], [address] Phone: [phone]   Fax:  [phone]  Name: Jennifer Webb MRN: [id number] Date of Birth: 27-Jan-1966

## 2017-08-05 ENCOUNTER — Encounter: Payer: Self-pay | Admitting: Physical Therapy

## 2017-08-05 ENCOUNTER — Ambulatory Visit: Payer: 59 | Admitting: Physical Therapy

## 2017-08-05 DIAGNOSIS — G8929 Other chronic pain: Secondary | ICD-10-CM

## 2017-08-05 DIAGNOSIS — M62830 Muscle spasm of back: Secondary | ICD-10-CM | POA: Diagnosis not present

## 2017-08-05 DIAGNOSIS — R293 Abnormal posture: Secondary | ICD-10-CM | POA: Diagnosis not present

## 2017-08-05 DIAGNOSIS — M545 Low back pain, unspecified: Secondary | ICD-10-CM

## 2017-08-05 NOTE — Therapy (Signed)
[facility] Outpatient Rehabilitation [facility] [address] [place], [place], [address] Phone: [phone]   Fax:  [phone]  Physical Therapy Treatment  Patient Details  Name: Vondell Sowell MRN: [id number] Date of Birth: 12-01-1965 Referring Provider: Dr Romero Belling   Encounter Date: 08/05/2017      PT End of Session - 08/05/17 1542    Visit Number 3   Number of Visits 16   Date for PT Re-Evaluation 09/25/17   PT Start Time 1500   PT Stop Time 1656   PT Time Calculation (min) 116 min   Activity Tolerance Patient tolerated treatment well   Behavior During Therapy [facility] for tasks assessed/performed      Past Medical History:  Diagnosis Date  . Arthritis    Rheumatiod  . High cholesterol   . Iron deficiency anemia     Past Surgical History:  Procedure Laterality Date  . CESAREAN SECTION  1987, 1983   x 2  . DILATION AND CURETTAGE OF UTERUS  1986  . 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      Subjective Assessment - 08/05/17 1507    Subjective was able to exercise on the gym  bike at work 10 .  She felt good.     Currently in Pain? Yes   Pain Score 4    Pain Location Back   Pain Orientation Lower   Pain Descriptors / Indicators Aching   Pain Type Acute pain   Aggravating Factors  standing longer tims,,  walking too long   Pain Relieving Factors change of position,  tylenol,                           [facility] Adult PT Treatment/Exercise - 08/05/17 0001      Lumbar Exercises: Stretches   Double Knee to Chest Stretch 3 reps;30 seconds   Pelvic Tilt --  10 x , 1 long breath   Piriformis Stretch Limitations supine foot on opposite knee lift feet up  3 X 30 each  HEP     Modalities   Modalities Moist Heat     Moist Heat Therapy   Number Minutes Moist Heat 10 Minutes   Moist Heat Location Lumbar Spine     Manual Therapy   Manual Therapy Soft tissue mobilization   Manual therapy comments tissue softened low back ,  on sidei                PT Education - 08/05/17 1542    Education provided Yes   Person(s) Educated Patient   Methods Explanation;Tactile cues;Verbal cues;Handout   Comprehension Verbalized understanding;Returned demonstration          PT Short Term Goals - 08/05/17 1545      PT SHORT TERM GOAL #1   Title Patient will demostrate a proper posterior pelvic tilt    Baseline needs cues,  able to do after cues   Time 4   Period Weeks   Status On-going     PT SHORT TERM GOAL #2   Title Patient will demsotrate full pain free passive hip mobility    Time 4   Period Weeks   Status Unable to assess     PT SHORT TERM GOAL #3   Title Patient will be independent with initial HEP    Baseline cues   Time 4   Period Weeks  Status On-going           PT Long Term Goals - 07/31/17 0958      PT LONG TERM GOAL #1   Title Patient will stand a twork for 45 minutes without self report of increased pain    Time 8   Period Weeks   Status New     PT LONG TERM GOAL #2   Title Patient will sit for 1/2 hour without increased pain in order to drive    Time 8   Period Weeks   Status New     PT LONG TERM GOAL #3   Title Patient will demsotrate a 38% limitation on FOTO    Time 8   Period Weeks   Status New     PT LONG TERM GOAL #4   Title Patient will demsotrate 5/5 gross bilateral lower extremity strength in order to imporve ability ot perfrom daily tasks    Time 8   Period Weeks   Status New               Plan - 08/05/17 1543    Clinical Impression Statement Patient continues to make progress with her HEP.  She has started exercising in the gym,  Seated and bicycle.  She was able to exercise with improved core contraction.   PT Treatment/Interventions ADLs/Self Care Home Management;Cryotherapy;Electrical Stimulation;Iontophoresis 4mg /ml Dexamethasone;Gait training;Moist Heat;Traction;Therapeutic  activities;Therapeutic exercise;Neuromuscular re-education;Splinting;Taping;Dry needling;Passive range of motion;Manual techniques;Patient/family education   PT Next Visit Plan  review light core strengthening with emphasis on PPT exercises. Consdier LAD; May benefit form manual therapy. Review lifting technique at some point,  consider side plank and quadriped   PT Home Exercise Plan PPT, LTR, sreated hamstring stretch; seted pirfirformis ,stretch.  Recumbant,  dead bug,  (3)    Consulted and Agree with Plan of Care Patient      Patient will benefit from skilled therapeutic intervention in order to improve the following deficits and impairments:     Visit Diagnosis: Chronic bilateral low back pain without sciatica  Muscle spasm of back  Abnormal posture     Problem List There are no active problems to display for this patient.   HARRIS,KAREN PTA 08/05/2017, 3:47 PM  [facility] [address] [place], [place], [place] Phone: [phone]   Fax:  [phone]  Name: Tamsen Reist MRN: Mariel Aloe Date of Birth: [birth year]

## 2017-08-05 NOTE — Patient Instructions (Signed)
From exercise drawer Dead bug, 3 exercises 10 x each,\Pelvic tilt,\10 x pirifirmis stretch 3 x 30 seconds

## 2017-08-11 ENCOUNTER — Encounter: Payer: Self-pay | Admitting: Physical Therapy

## 2017-08-11 ENCOUNTER — Ambulatory Visit: Payer: 59 | Admitting: Physical Therapy

## 2017-08-11 DIAGNOSIS — G8929 Other chronic pain: Secondary | ICD-10-CM

## 2017-08-11 DIAGNOSIS — M62830 Muscle spasm of back: Secondary | ICD-10-CM | POA: Diagnosis not present

## 2017-08-11 DIAGNOSIS — M545 Low back pain, unspecified: Secondary | ICD-10-CM

## 2017-08-11 DIAGNOSIS — R293 Abnormal posture: Secondary | ICD-10-CM | POA: Diagnosis not present

## 2017-08-11 NOTE — Therapy (Signed)
[facility] Outpatient Rehabilitation [facility] [address] [place], [place], [address] Phone: [phone]   Fax:  [phone]  Physical Therapy Treatment  Patient Details  Name: Jennifer Webb MRN: [id number] Date of Birth: [birth year] Referring Provider: Dr Romero Belling   Encounter Date: 08/11/2017      PT End of Session - 08/11/17 1334    Visit Number 4   Number of Visits 16   Date for PT Re-Evaluation 09/25/17   PT Start Time 0730   PT Stop Time 0813   PT Time Calculation (min) 43 min   Activity Tolerance Patient tolerated treatment well   Behavior During Therapy [facility] for tasks assessed/performed      Past Medical History:  Diagnosis Date  . Arthritis    Rheumatiod  . High cholesterol   . Iron deficiency anemia     Past Surgical History:  Procedure Laterality Date  . CESAREAN SECTION  1987, 1983   x 2  . DILATION AND CURETTAGE OF UTERUS  1986  . 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      Subjective Assessment - 08/11/17 0728    Subjective Has some busy walking bays at work.  I now have a heating pad.  Pain 7/10 at the highest anf 5/10 now.  she tried her exercises but the heating pad helped the most.   Currently in Pain? Yes   Pain Score 5   7/10 Sunday   Pain Location Back   Pain Orientation Lower   Pain Descriptors / Indicators Aching;Constant   Pain Frequency Constant   Aggravating Factors  busy days of work   Pain Relieving Factors heating pad   Effect of Pain on Daily Activities difficult with work   Multiple Pain Sites --  left knee throbs at times  5/10            [facility] PT Assessment - 08/11/17 0001      PROM   Overall PROM Comments passive hip flexion to 112 right,   degrees ,  Left:  114 Painfree                     [facility] Adult PT Treatment/Exercise - 08/11/17 0001      Therapeutic Activites    Therapeutic Activities ADL's;Work Simulation   ADL's lifting from mat and floor.   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  ROM improving.  Only ROM not limited is left hip abduction.     Moist Heat Therapy   Number Minutes Moist Heat 10 Minutes   Moist Heat Location Lumbar Spine                PT Education - 08/11/17 1739    Education provided Yes   Education Details Patient moving, lifting   Person(s) Educated Patient   Methods Explanation;Demonstration;Verbal cues   Comprehension Verbalized understanding;Returned demonstration          PT Short Term Goals - 08/11/17 1736      PT SHORT TERM GOAL #1   Title Patient will demostrate a proper posterior pelvic tilt  Time 4   Period Weeks   Status Unable to assess     PT SHORT TERM GOAL #2   Title Patient will demsotrate full pain free passive hip mobility    Baseline Not yet full ROM,  end range pain   Time 4   Period Weeks   Status On-going     PT SHORT TERM GOAL #3   Title Patient will be independent with initial HEP    Time 4   Period Weeks   Status Unable to assess           PT Long Term Goals - 07/31/17 0277      PT LONG TERM GOAL #1   Title Patient will stand a twork for 45 minutes without self report of increased pain    Time 8   Period Weeks   Status New     PT LONG TERM GOAL #2   Title Patient will sit for 1/2 hour without increased pain in order to drive    Time 8   Period Weeks   Status New     PT LONG TERM GOAL #3   Title Patient will demsotrate a 38% limitation on FOTO    Time 8   Period Weeks   Status New     PT LONG TERM GOAL #4   Title Patient will demsotrate 5/5 gross bilateral lower extremity strength in order to imporve ability ot perfrom  daily tasks    Time 8   Period Weeks   Status New               Plan - 08/11/17 1733    Clinical Impression Statement Stretching/ROM , and  ther activities focus today.  All ROM limited in hips except hip abduction left hip.  No increased pain noted today.  progress toward ROM goals. We simulated lifting techniques and moving , rolling and pulling patients in bed.     PT Treatment/Interventions ADLs/Self Care Home Management;Cryotherapy;Electrical Stimulation;Iontophoresis 4mg /ml Dexamethasone;Gait training;Moist Heat;Traction;Therapeutic activities;Therapeutic exercise;Neuromuscular re-education;Splinting;Taping;Dry needling;Passive range of motion;Manual techniques;Patient/family education   PT Next Visit Plan  review light core strengthening with emphasis on PPT exercises. Consdier LAD; May benefit form manual therapy. consider side plank and quadriped   PT Home Exercise Plan PPT, LTR, sreated hamstring stretch; seted pirfirformis ,stretch.  Recumbant,  dead bug,  (3)    Consulted and Agree with Plan of Care Patient      Patient will benefit from skilled therapeutic intervention in order to improve the following deficits and impairments:     Visit Diagnosis: Chronic bilateral low back pain without sciatica  Muscle spasm of back  Abnormal posture     Problem List There are no active problems to display for this patient.   HARRIS,KAREN PTA 08/11/2017, 5:40 PM  [facility] [address] [place], [place], [place] Phone: [phone]   Fax:  [phone]  Name: Carianne Taira MRN: Mariel Aloe Date of Birth: [birth year]

## 2017-08-13 ENCOUNTER — Ambulatory Visit: Payer: 59 | Admitting: Physical Therapy

## 2017-08-13 DIAGNOSIS — M545 Low back pain, unspecified: Secondary | ICD-10-CM

## 2017-08-13 DIAGNOSIS — M62830 Muscle spasm of back: Secondary | ICD-10-CM | POA: Diagnosis not present

## 2017-08-13 DIAGNOSIS — R293 Abnormal posture: Secondary | ICD-10-CM | POA: Diagnosis not present

## 2017-08-13 DIAGNOSIS — G8929 Other chronic pain: Secondary | ICD-10-CM | POA: Diagnosis not present

## 2017-08-13 NOTE — Therapy (Signed)
[place], [place], [address] Phone: [phone]   Fax:  [phone]  Physical Therapy Treatment  Patient Details  Name: Jennifer Webb MRN: [id number] Date of Birth: [birth year] Referring Provider: Dr Laroy Apple   Encounter Date: 08/13/2017      PT End of Session - 08/13/17 0716    Visit Number 5   Number of Visits 16   Date for PT Re-Evaluation 09/25/17   PT Start Time 0715   PT Stop Time 0810   PT Time Calculation (min) 55 min      Past Medical History:  Diagnosis Date  . Arthritis    Rheumatiod  . High cholesterol   . Iron deficiency anemia     Past Surgical History:  Procedure Laterality Date  . [employer]   x 2  . DILATION AND CURETTAGE OF UTERUS  1986  . 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      Subjective Assessment - 08/13/17 0716    Subjective Yesterday my pain was terrible after work.    Currently in Pain? Yes   Pain Score 4    Pain Location Back   Pain Orientation Lower   Pain Descriptors / Indicators Aching                         [facility] Adult PT Treatment/Exercise - 08/13/17 0001      Lumbar Exercises: Stretches   Active Hamstring Stretch 3 reps;30 seconds   Single Knee to Chest Stretch Limitations 3 x 30,  each   Lower Trunk Rotation Limitations 10 x      Lumbar Exercises: Supine   Clam 20 reps   Heel Slides 20 reps   Bent Knee Raise 20 reps   Bridge 20 reps   Straight Leg Raise 10 reps   Other Supine Lumbar Exercises posterior pelvic tilt 2x10 supine and standing                 PT Education - 08/13/17 0750    Education provided Yes   Education Details Core HEP    Person(s) Educated Patient   Methods Explanation;Handout   Comprehension Verbalized understanding          PT Short Term Goals - 08/13/17 0743      PT SHORT TERM GOAL #1   Title  Patient will demostrate a proper posterior pelvic tilt    Time 4   Period Weeks   Status Achieved     PT SHORT TERM GOAL #2   Title Patient will demsotrate full pain free passive hip mobility    Baseline Not yet full ROM,  end range pain   Time 4   Period Weeks   Status On-going     PT SHORT TERM GOAL #3   Title Patient will be independent with initial HEP    Time 4   Period Weeks   Status Achieved           PT Long Term Goals - 07/31/17 5701      PT LONG TERM GOAL #1   Title Patient will stand a twork for 45 minutes without self report of increased pain    Time 8   Period Weeks   Status New     PT LONG TERM GOAL #2   Title Patient will sit for 1/2  hour without increased pain in order to drive    Time 8   Period Weeks   Status New     PT LONG TERM GOAL #3   Title Patient will demsotrate a 38% limitation on FOTO    Time 8   Period Weeks   Status New     PT LONG TERM GOAL #4   Title Patient will demsotrate 5/5 gross bilateral lower extremity strength in order to imporve ability ot perfrom daily tasks    Time 8   Period Weeks   Status New               Plan - 08/13/17 0744    Clinical Impression Statement Pt is independent with initial HEP. She can demonstrate a posterior pelvic tilt. We worked on engaging core with standing and walking. Focused core strength today and added to HEP. Pt reports pain up to 7/10 after work. STG# 1, #3 met.    PT Next Visit Plan  review light core strengthening with emphasis on PPT exercises. Consdier LAD; May benefit form manual therapy. consider side plank and quadriped   PT Home Exercise Plan PPT, LTR, sreated hamstring stretch; seted pirfirformis ,stretch.  Recumbant,  dead bug,  (3) pre pilates HEP    Consulted and Agree with Plan of Care Patient      Patient will benefit from skilled therapeutic intervention in order to improve the following deficits and impairments:     Visit Diagnosis: Chronic bilateral low back  pain without sciatica  Muscle spasm of back  Abnormal posture     Problem List There are no active problems to display for this patient.   Dorene Ar, [place] 08/13/2017, 8:00 AM  [place], [place], [address] Phone: [phone]   Fax:  [phone]  Name: Jennifer Webb MRN: [id number] Date of Birth: [birth year]

## 2017-08-18 ENCOUNTER — Ambulatory Visit: Payer: 59 | Admitting: Physical Therapy

## 2017-08-18 DIAGNOSIS — M62830 Muscle spasm of back: Secondary | ICD-10-CM

## 2017-08-18 DIAGNOSIS — R293 Abnormal posture: Secondary | ICD-10-CM

## 2017-08-18 DIAGNOSIS — M545 Low back pain: Principal | ICD-10-CM

## 2017-08-18 DIAGNOSIS — G8929 Other chronic pain: Secondary | ICD-10-CM | POA: Diagnosis not present

## 2017-08-18 NOTE — Therapy (Signed)
[facility] Outpatient Rehabilitation [facility] [address] [place], [place], [address] Phone: [phone]   Fax:  [phone]  Physical Therapy Treatment  Patient Details  Name: Jennifer Webb MRN: [id number] Date of Birth: 05-10-1966 Referring Provider: Dr Romero Belling   Encounter Date: 08/18/2017      PT End of Session - 08/18/17 1603    Visit Number 6   Number of Visits 16   Date for PT Re-Evaluation 09/25/17   PT Start Time 1545   PT Stop Time 1626   PT Time Calculation (min) 41 min   Activity Tolerance Patient tolerated treatment well   Behavior During Therapy [facility] for tasks assessed/performed      Past Medical History:  Diagnosis Date  . Arthritis    Rheumatiod  . High cholesterol   . Iron deficiency anemia     Past Surgical History:  Procedure Laterality Date  . CESAREAN SECTION  1987, 1983   x 2  . DILATION AND CURETTAGE OF UTERUS  1986  . 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      Subjective Assessment - 08/18/17 1601    Subjective Patient reports her pain after work today is about a 4/10. She was able to sit more at work today,. She wasnt too sore after her last treatment. She has been working on some stretches and exercises at home.    Patient Stated Goals Patient will have less pain in her back    Currently in Pain? Yes   Pain Score 4    Pain Location Back   Pain Orientation Lower   Pain Descriptors / Indicators Aching   Pain Type Acute pain   Pain Onset More than a month ago   Pain Frequency Constant   Aggravating Factors  busy days at work    Pain Relieving Factors heating pad    Effect of Pain on Daily Activities difficulty with work                          [facility] Adult PT Treatment/Exercise - 08/18/17 0001      Lumbar Exercises: Stretches   Active Hamstring Stretch 3 reps;30 seconds   Single Knee to Chest Stretch Limitations 3 x 30,  each   Lower Trunk Rotation Limitations 10 x    Piriformis Stretch Limitations supine knee to opposite chest 3x20sec      Lumbar Exercises: Aerobic   Stationary Bike Nustep L5 x 5 minutes      Lumbar Exercises: Supine   Clam 20 reps   Heel Slides 20 reps   Bent Knee Raise 20 reps   Bridge 20 reps   Straight Leg Raise 10 reps     Moist Heat Therapy   Number Minutes Moist Heat 10 Minutes   Moist Heat Location Lumbar Spine     Manual Therapy   Manual Therapy Soft tissue mobilization   Manual therapy comments tissue softened low back ,  on sidei                PT Education - 08/18/17 1602    Education provided Yes   Education Details reviewed HEp and technique    Person(s) Educated Patient   Methods Explanation;Demonstration;Tactile cues;Verbal cues   Comprehension Verbalized understanding;Returned demonstration;Verbal cues required          PT Short Term Goals - 08/13/17 3500  PT SHORT TERM GOAL #1   Title Patient will demostrate a proper posterior pelvic tilt    Time 4   Period Weeks   Status Achieved     PT SHORT TERM GOAL #2   Title Patient will demsotrate full pain free passive hip mobility    Baseline Not yet full ROM,  end range pain   Time 4   Period Weeks   Status On-going     PT SHORT TERM GOAL #3   Title Patient will be independent with initial HEP    Time 4   Period Weeks   Status Achieved           PT Long Term Goals - 07/31/17 8101      PT LONG TERM GOAL #1   Title Patient will stand a twork for 45 minutes without self report of increased pain    Time 8   Period Weeks   Status New     PT LONG TERM GOAL #2   Title Patient will sit for 1/2 hour without increased pain in order to drive    Time 8   Period Weeks   Status New     PT LONG TERM GOAL #3   Title Patient will demsotrate a 38% limitation on FOTO    Time 8   Period Weeks   Status New     PT LONG TERM GOAL #4   Title Patient will demsotrate 5/5 gross bilateral lower  extremity strength in order to imporve ability ot perfrom daily tasks    Time 8   Period Weeks   Status New               Plan - 08/18/17 1628    Clinical Impression Statement Patient tolerated treatment well. She had no significant increase in pain with treatment. No spasming felt with soft tissue mobilization of the lumbar spine.    Clinical Presentation Evolving   Clinical Decision Making Moderate   Rehab Potential Good   PT Frequency 2x / week   PT Duration 8 weeks   PT Treatment/Interventions ADLs/Self Care Home Management;Cryotherapy;Electrical Stimulation;Iontophoresis 4mg /ml Dexamethasone;Gait training;Moist Heat;Traction;Therapeutic activities;Therapeutic exercise;Neuromuscular re-education;Splinting;Taping;Dry needling;Passive range of motion;Manual techniques;Patient/family education   PT Next Visit Plan  review light core strengthening with emphasis on PPT exercises. Consdier LAD; May benefit form manual therapy. consider side plank and quadriped   PT Home Exercise Plan PPT, LTR, sreated hamstring stretch; seted pirfirformis ,stretch.  Recumbant,  dead bug,  (3) pre pilates HEP    Consulted and Agree with Plan of Care Patient      Patient will benefit from skilled therapeutic intervention in order to improve the following deficits and impairments:     Visit Diagnosis: Chronic bilateral low back pain without sciatica  Muscle spasm of back  Abnormal posture     Problem List There are no active problems to display for this patient.   PT DPT  08/18/2017, 4:33 PM  [facility] [address] [place], [place], [place] Phone: [phone]   Fax:  [phone]  Name: Jennifer Webb MRN: Mariel Aloe Date of Birth: 1966/10/10

## 2017-08-24 ENCOUNTER — Ambulatory Visit (INDEPENDENT_AMBULATORY_CARE_PROVIDER_SITE_OTHER): Payer: 59 | Admitting: Family Medicine

## 2017-08-24 ENCOUNTER — Encounter: Payer: Self-pay | Admitting: Family Medicine

## 2017-08-24 VITALS — BP 150/90 | HR 61 | Wt 222.0 lb

## 2017-08-24 DIAGNOSIS — M069 Rheumatoid arthritis, unspecified: Secondary | ICD-10-CM | POA: Diagnosis not present

## 2017-08-24 DIAGNOSIS — D509 Iron deficiency anemia, unspecified: Secondary | ICD-10-CM | POA: Diagnosis not present

## 2017-08-24 DIAGNOSIS — Z7689 Persons encountering health services in other specified circumstances: Secondary | ICD-10-CM | POA: Diagnosis not present

## 2017-08-24 DIAGNOSIS — J0140 Acute pansinusitis, unspecified: Secondary | ICD-10-CM

## 2017-08-24 DIAGNOSIS — R030 Elevated blood-pressure reading, without diagnosis of hypertension: Secondary | ICD-10-CM | POA: Diagnosis not present

## 2017-08-24 DIAGNOSIS — E785 Hyperlipidemia, unspecified: Secondary | ICD-10-CM | POA: Insufficient documentation

## 2017-08-24 DIAGNOSIS — M1990 Unspecified osteoarthritis, unspecified site: Secondary | ICD-10-CM | POA: Insufficient documentation

## 2017-08-24 MED ORDER — AMOXICILLIN 875 MG PO TABS: 875.0000 mg | ORAL_TABLET | Freq: Two times a day (BID) | ORAL | 0 refills | Status: DC

## 2017-08-24 MED FILL — AMOXICILLIN 875 MG TABLET: 875 | 10 days supply | Qty: 20 | Fill #0

## 2017-08-24 NOTE — Progress Notes (Signed)
   Subjective:    Patient ID: Jennifer Webb, female    DOB: [birth year], 51 y.o.   MRN: [id number]  HPI Chief Complaint  Patient presents with  . new pt    new get established, needs a couple refills and needs a referral to rheumatologist- [facility]. sees ortho 11/8 for back pain- seeing [facility]. having hot flashes   She is new to the practice and here to establish care.  Previous PCP: [facility]. Dr. Ronne Binning. Last visit was 6 months ago.   Other providers: rheumatologist- Dr. Deanne Coffer.  Orthopedist- Dr. Eulah Pont for chronic back pain.  OB/GYN- Nestor Ramp   Complains of a 2 month history of progressively worsening sinus pressure, nasal congestion, and sore throat. States she feels like she has a sinus infection. Reports taking multiple OTC remedies without relief.   She has been going to PT for chronic low back pain.   States she was diagnosed with RA in 2012. At time of diagnosis she had iIritis. Has been taking Humira for the past 3 years. Last flare was in 2015. No records available to review regarding her RA.   States she is switching providers due to not being able to get her FMLA forms filled out for back pain, knee pain.   She works as a Lawyer.   States she is not taking flexeril or Tramadol very often. S Has an IUD and it is due to have it removed next month. She will see her OB/GYN for this.  No periods for 2 months. Hot flashes.   Divorced. Has 1 living child. 1 child is deceased as a result of sickle cell disease.   Denies fever, chills, dizziness, chest pain, palpitations, shortness of breath, abdominal pain, N/V/D, urinary symptoms.  Reviewed allergies, medications, past medical, surgical, family, and social history.    Review of Systems Pertinent positives and negatives in the history of present illness.     Objective:   Physical Exam BP (!) 150/90   Pulse 61   Wt 222 lb (100.7 kg)   BMI 36.38 kg/m         Assessment &  Plan:  Rheumatoid arthritis, involving unspecified site, unspecified rheumatoid factor presence (HCC) - Plan: Ambulatory referral to Rheumatology  Iron deficiency anemia, unspecified iron deficiency anemia type  Acute non-recurrent pansinusitis - Plan: amoxicillin (AMOXIL) 875 MG tablet  Encounter to establish care  Blood pressure elevated without history of HTN  She would like to establish with a new rheumatologist to have a clean slate. Referral made.  She appears to have a sinus infection. Amoxil prescribed and she may use OTC treatments. These were discussed.  She is aware that her BP is elevated. No history of HTN. She will check her BP outside of here and report back readings above goal range.  Follow up as needed.  Will attempt to get medical records from her previous PCP.

## 2017-08-24 NOTE — Patient Instructions (Addendum)
Take the antibiotic as prescribed. Use saline nasal spray.   Your blood pressure is elevated today at 150/90. I recommend that you check it outside of our office and let us know if your BP is consistently > 130/80.   Please Get your Previous Rheumatologist records sent over to [facility]  Address: [address], [place], [place] [address] Phone: [phone] Fax: [phone]

## 2017-08-25 ENCOUNTER — Ambulatory Visit: Payer: 59 | Admitting: Physical Therapy

## 2017-08-25 ENCOUNTER — Encounter: Payer: Self-pay | Admitting: Physical Therapy

## 2017-08-25 DIAGNOSIS — M545 Low back pain, unspecified: Secondary | ICD-10-CM

## 2017-08-25 DIAGNOSIS — M62830 Muscle spasm of back: Secondary | ICD-10-CM

## 2017-08-25 DIAGNOSIS — G8929 Other chronic pain: Secondary | ICD-10-CM

## 2017-08-25 DIAGNOSIS — R293 Abnormal posture: Secondary | ICD-10-CM | POA: Diagnosis not present

## 2017-08-26 MED FILL — HUMIRA 40 MG/0.8ML PSKT: 40 | 28 days supply | Qty: 2 | Fill #2

## 2017-08-26 NOTE — Therapy (Signed)
[facility] Outpatient Rehabilitation [facility] [address] [place], [place], [address] Phone: [phone]   Fax:  [phone]  Physical Therapy Treatment  Patient Details  Name: Jennifer Webb MRN: [id number] Date of Birth: June 05, 1966 Referring Provider: Dr Romero Belling   Encounter Date: 08/25/2017      PT End of Session - 08/25/17 1609    Visit Number 7   Number of Visits 16   Date for PT Re-Evaluation 09/25/17   PT Start Time 1545   PT Stop Time 1626   PT Time Calculation (min) 41 min   Activity Tolerance Patient tolerated treatment well   Behavior During Therapy [facility] for tasks assessed/performed      Past Medical History:  Diagnosis Date  . Arthritis    Rheumatiod  . High cholesterol   . Iron deficiency anemia   . Iron deficiency anemia     Past Surgical History:  Procedure Laterality Date  . CESAREAN SECTION  1987, 1983   x 2  . DILATION AND CURETTAGE OF UTERUS  1986  . 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      Subjective Assessment - 08/25/17 1553    Subjective Patient reports she has good days and bad days but the back always hurts at the end of the day. She was on her feet all day today.    Limitations Walking   Currently in Pain? Yes   Pain Score 3    Pain Location Back   Pain Orientation Lower   Pain Descriptors / Indicators Aching   Pain Type Acute pain   Pain Onset More than a month ago   Pain Frequency Constant   Aggravating Factors  busy days at work    Pain Relieving Factors heating pad    Effect of Pain on Daily Activities difficulty with work                          [facility] Adult PT Treatment/Exercise - 08/26/17 0001      Lumbar Exercises: Stretches   Active Hamstring Stretch 3 reps;30 seconds   Single Knee to Chest Stretch Limitations 3 x 30,  each   Lower Trunk Rotation Limitations 10 x    Piriformis Stretch Limitations supine knee to  opposite chest 3x20sec      Lumbar Exercises: Aerobic   Stationary Bike Nustep L5 x 5 minutes      Lumbar Exercises: Supine   Clam 20 reps   Heel Slides 20 reps   Bent Knee Raise 20 reps   Bridge 20 reps   Straight Leg Raise 10 reps     Moist Heat Therapy   Number Minutes Moist Heat 10 Minutes   Moist Heat Location Lumbar Spine     Manual Therapy   Manual Therapy Soft tissue mobilization   Soft tissue mobilization STM to lumbar paraspinals.                 PT Education - 08/25/17 1608    Education provided Yes   Education Details reviewed technique with ther-ex.    Person(s) Educated Patient   Methods Explanation;Demonstration;Tactile cues;Verbal cues   Comprehension Verbalized understanding;Returned demonstration;Verbal cues required          PT Short Term Goals - 08/13/17 0743      PT SHORT TERM GOAL #1   Title Patient will demostrate a proper posterior pelvic  tilt    Time 4   Period Weeks   Status Achieved     PT SHORT TERM GOAL #2   Title Patient will demsotrate full pain free passive hip mobility    Baseline Not yet full ROM,  end range pain   Time 4   Period Weeks   Status On-going     PT SHORT TERM GOAL #3   Title Patient will be independent with initial HEP    Time 4   Period Weeks   Status Achieved           PT Long Term Goals - 07/31/17 1610      PT LONG TERM GOAL #1   Title Patient will stand a twork for 45 minutes without self report of increased pain    Time 8   Period Weeks   Status New     PT LONG TERM GOAL #2   Title Patient will sit for 1/2 hour without increased pain in order to drive    Time 8   Period Weeks   Status New     PT LONG TERM GOAL #3   Title Patient will demsotrate a 38% limitation on FOTO    Time 8   Period Weeks   Status New     PT LONG TERM GOAL #4   Title Patient will demsotrate 5/5 gross bilateral lower extremity strength in order to imporve ability ot perfrom daily tasks    Time 8   Period  Weeks   Status New               Plan - 08/25/17 1610    Clinical Impression Statement Patient tolerated exercises well although she was fatigued. She was encouraged to continue workinng on her exercises at home.    Clinical Presentation Evolving   Clinical Decision Making Moderate   Rehab Potential Good   PT Frequency 2x / week   PT Duration 8 weeks   PT Treatment/Interventions ADLs/Self Care Home Management;Cryotherapy;Electrical Stimulation;Iontophoresis 4mg /ml Dexamethasone;Gait training;Moist Heat;Traction;Therapeutic activities;Therapeutic exercise;Neuromuscular re-education;Splinting;Taping;Dry needling;Passive range of motion;Manual techniques;Patient/family education   PT Next Visit Plan  review light core strengthening with emphasis on PPT exercises. Consdier LAD; May benefit form manual therapy. consider side plank and quadriped   PT Home Exercise Plan PPT, LTR, sreated hamstring stretch; seted pirfirformis ,stretch.  Recumbant,  dead bug,  (3) pre pilates HEP    Consulted and Agree with Plan of Care Patient      Patient will benefit from skilled therapeutic intervention in order to improve the following deficits and impairments:     Visit Diagnosis: Chronic bilateral low back pain without sciatica  Muscle spasm of back  Abnormal posture     Problem List Patient Active Problem List   Diagnosis Date Noted  . High cholesterol   . Arthritis   . Iron deficiency anemia     08/26/2017, 8:34 AM  [facility] [address] [place], [place], [place] Phone: [phone]   Fax:  [phone]  Name: Jennifer Webb MRN: Jennifer Webb Date of Birth: 01-23-1966

## 2017-08-27 ENCOUNTER — Ambulatory Visit: Payer: 59 | Admitting: Physical Therapy

## 2017-08-27 ENCOUNTER — Encounter: Payer: Self-pay | Admitting: Family Medicine

## 2017-08-27 DIAGNOSIS — M62830 Muscle spasm of back: Secondary | ICD-10-CM

## 2017-08-27 DIAGNOSIS — G8929 Other chronic pain: Secondary | ICD-10-CM

## 2017-08-27 DIAGNOSIS — M47819 Spondylosis without myelopathy or radiculopathy, site unspecified: Secondary | ICD-10-CM | POA: Insufficient documentation

## 2017-08-27 DIAGNOSIS — M545 Low back pain, unspecified: Secondary | ICD-10-CM | POA: Insufficient documentation

## 2017-08-27 DIAGNOSIS — R293 Abnormal posture: Secondary | ICD-10-CM | POA: Diagnosis not present

## 2017-08-27 DIAGNOSIS — H209 Unspecified iridocyclitis: Secondary | ICD-10-CM

## 2017-08-27 DIAGNOSIS — J309 Allergic rhinitis, unspecified: Secondary | ICD-10-CM

## 2017-08-27 DIAGNOSIS — Z1589 Genetic susceptibility to other disease: Secondary | ICD-10-CM

## 2017-08-27 DIAGNOSIS — D709 Neutropenia, unspecified: Secondary | ICD-10-CM | POA: Insufficient documentation

## 2017-08-27 DIAGNOSIS — K219 Gastro-esophageal reflux disease without esophagitis: Secondary | ICD-10-CM

## 2017-08-27 HISTORY — DX: Gastro-esophageal reflux disease without esophagitis: K21.9

## 2017-08-27 HISTORY — DX: Allergic rhinitis, unspecified: J30.9

## 2017-08-27 HISTORY — DX: Unspecified iridocyclitis: H20.9

## 2017-08-27 HISTORY — DX: Low back pain, unspecified: M54.50

## 2017-08-27 HISTORY — DX: Spondylosis without myelopathy or radiculopathy, site unspecified: M47.819

## 2017-08-27 HISTORY — DX: Genetic susceptibility to other disease: Z15.89

## 2017-08-27 HISTORY — DX: Neutropenia, unspecified: D70.9

## 2017-08-28 NOTE — Therapy (Signed)
Jennifer Webb, [place], [address] Phone: [phone]   Fax:  [phone]  Physical Therapy Treatment  Patient Details  Name: Jennifer Webb MRN: [id number] Date of Birth: Feb 22, 1966 Referring Provider: Dr Jennifer Webb   Encounter Date: 08/27/2017      PT End of Session - 08/28/17 1006    Visit Number 8   Number of Visits 16   Date for PT Re-Evaluation 09/25/17   PT Start Time 1700   PT Stop Time 1637   PT Time Calculation (min) 52 min   Activity Tolerance Patient tolerated treatment well   Behavior During Therapy [facility] for tasks assessed/performed      Past Medical History:  Diagnosis Date  . Allergic rhinitis 08/27/2017  . Arthritis    Rheumatiod  . GERD (gastroesophageal reflux disease) 08/27/2017  . High cholesterol   . 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 Iritis 08/27/2017  . Iron deficiency anemia   . Iron deficiency anemia   . Low back pain 08/27/2017  . Neutropenia ([place]) 08/27/2017  . 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 [place]   x 2  . DILATION AND CURETTAGE OF UTERUS  1986  . 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      Subjective Assessment - 08/27/17 1548    Subjective Patient is not having a lot of pain right now but she did not do much at work. Yesterday she was very busy and had a lot of pain.    Limitations Walking   Patient Stated Goals Patient will have less pain in her back    Currently in Pain? No/denies                         [facility] Adult PT Treatment/Exercise - 08/28/17 0001      Lumbar Exercises: Stretches   Active Hamstring Stretch 3 reps;30 seconds   Single Knee to Chest Stretch Limitations 3 x 30,  each   Lower Trunk Rotation Limitations 10 x    Piriformis Stretch Limitations supine knee to opposite chest  3x20sec      Lumbar Exercises: Aerobic   Stationary Bike Nustep L5 x 5 minutes      Lumbar Exercises: Standing   Other Standing Lumbar Exercises shoulder exntesion red wqith ab breathing 2x10 row red 2x10    Other Standing Lumbar Exercises reviewed 20lb box lift x10 high handles; x5 low handles; reviewed squats      Lumbar Exercises: Supine   Clam 20 reps   Heel Slides 20 reps   Bent Knee Raise 20 reps   Bridge 20 reps   Straight Leg Raise 10 reps     Moist Heat Therapy   Number Minutes Moist Heat 10 Minutes   Moist Heat Location Lumbar Spine                PT Education - 08/27/17 1554    Education provided Yes   Education Details reviewed tehcnique with exercises.    Person(s) Educated Patient   Methods Explanation;Demonstration;Tactile cues;Verbal cues   Comprehension Verbalized understanding;Returned demonstration;Verbal cues required          PT Short Term Goals - 08/13/17 0743      PT SHORT TERM GOAL #1   Title Patient will demostrate a proper  posterior pelvic tilt    Time 4   Period Weeks   Status Achieved     PT SHORT TERM GOAL #2   Title Patient will demsotrate full pain free passive hip mobility    Baseline Not yet full ROM,  end range pain   Time 4   Period Weeks   Status On-going     PT SHORT TERM GOAL #3   Title Patient will be independent with initial HEP    Time 4   Period Weeks   Status Achieved           PT Long Term Goals - 07/31/17 7591      PT LONG TERM GOAL #1   Title Patient will stand a twork for 45 minutes without self report of increased pain    Time 8   Period Weeks   Status New     PT LONG TERM GOAL #2   Title Patient will sit for 1/2 hour without increased pain in order to drive    Time 8   Period Weeks   Status New     PT LONG TERM GOAL #3   Title Patient will demsotrate a 38% limitation on FOTO    Time 8   Period Weeks   Status New     PT LONG TERM GOAL #4   Title Patient will demsotrate 5/5 gross  bilateral lower extremity strength in order to imporve ability ot perfrom daily tasks    Time 8   Period Weeks   Status New               Plan - 08/28/17 1006    Clinical Impression Statement Patient tolerated treatment well. Therapy reviewed lifting technique. She reuqired only min cuing. She was encouraged to work on squats at home. She was encouraged to continue hewr exercise sand be aware of her posture at work.    Clinical Presentation Evolving   Clinical Decision Making Moderate   Rehab Potential Good   PT Frequency 2x / week   PT Duration 8 weeks   PT Treatment/Interventions ADLs/Self Care Home Management;Cryotherapy;Electrical Stimulation;Iontophoresis 63m/ml Dexamethasone;Gait training;Moist Heat;Traction;Therapeutic activities;Therapeutic exercise;Neuromuscular re-education;Splinting;Taping;Dry needling;Passive range of motion;Manual techniques;Patient/family education   PT Next Visit Plan  review light core strengthening with emphasis on PPT exercises. Consdier LAD; May benefit form manual therapy. consider side plank and quadriped   PT Home Exercise Plan PPT, LTR, sreated hamstring stretch; seted pirfirformis ,stretch.  Recumbant,  dead bug,  (3) pre pilates HEP    Consulted and Agree with Plan of Care Patient      Patient will benefit from skilled therapeutic intervention in order to improve the following deficits and impairments:     Visit Diagnosis: Chronic bilateral low back pain without sciatica  Muscle spasm of back  Abnormal posture     Problem List Patient Active Problem List   Diagnosis Date Noted  . Neutropenia ([place] 08/27/2017  . GERD (gastroesophageal reflux disease) 08/27/2017  . Spondyloarthropathy ([place] 08/27/2017  . Allergic rhinitis 08/27/2017  . Iritis 08/27/2017  . 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 Low back pain 08/27/2017  . Hyperlipidemia   . Arthritis   . Iron deficiency anemia     DCarney LivingPT DPT  08/28/2017,  10:09 AM  [facility]GBlackg[address] [place] [address]Phone: [phone]  Fax:  [phone] Name: JLaniah GrimmMRN: [id number] of Birth: [birth year]

## 2017-08-31 ENCOUNTER — Telehealth: Payer: Self-pay | Admitting: Family Medicine

## 2017-08-31 NOTE — Telephone Encounter (Signed)
Rcvd office notes, xrays, labs from [facility]

## 2017-09-01 ENCOUNTER — Ambulatory Visit: Payer: 59 | Admitting: Physical Therapy

## 2017-09-01 ENCOUNTER — Encounter: Payer: Self-pay | Admitting: Physical Therapy

## 2017-09-01 DIAGNOSIS — G8929 Other chronic pain: Secondary | ICD-10-CM

## 2017-09-01 DIAGNOSIS — R293 Abnormal posture: Secondary | ICD-10-CM | POA: Diagnosis not present

## 2017-09-01 DIAGNOSIS — M62830 Muscle spasm of back: Secondary | ICD-10-CM | POA: Diagnosis not present

## 2017-09-01 DIAGNOSIS — M545 Low back pain, unspecified: Secondary | ICD-10-CM

## 2017-09-01 NOTE — Therapy (Signed)
[place], [place], [address] Phone: [phone]   Fax:  [phone]  Physical Therapy Treatment  Patient Details  Name: Jennifer Webb MRN: [id number] Date of Birth: 1966/06/29 Referring Provider: Dr Laroy Apple   Encounter Date: 09/01/2017    Past Medical History:  Diagnosis Date  . Allergic rhinitis 08/27/2017  . Arthritis    Rheumatiod  . GERD (gastroesophageal reflux disease) 08/27/2017  . High cholesterol   . 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 Iritis 08/27/2017  . Iron deficiency anemia   . Iron deficiency anemia   . Low back pain 08/27/2017  . Neutropenia ([place]) 08/27/2017  . 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 [place]   x 2  . DILATION AND CURETTAGE OF UTERUS  1986  . 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      Subjective Assessment - 09/01/17 1552    Subjective Patient has not been working much over the past few days. On days were she works she does feel pain. She feels like she is confortable with her HEP and would like to continue on her own at home.    Limitations Walking   Patient Stated Goals Patient will have less pain in her back    Currently in Pain? No/denies                         [facility] Adult PT Treatment/Exercise - 09/01/17 0001      Lumbar Exercises: Stretches   Active Hamstring Stretch 3 reps;30 seconds   Single Knee to Chest Stretch Limitations 3 x 30,  each   Lower Trunk Rotation Limitations 10 x    Piriformis Stretch Limitations supine knee to opposite chest 3x20sec      Lumbar Exercises: Aerobic   Stationary Bike Nustep L5 x 5 minutes      Lumbar Exercises: Standing   Other Standing Lumbar Exercises shoulder exntesion red wqith ab breathing 2x10 row red 2x10    Other Standing Lumbar Exercises  reviewed 20lb box lift x10 high handles; x5 low handles; reviewed squats      Lumbar Exercises: Supine   Clam 20 reps   Heel Slides 20 reps   Bent Knee Raise 20 reps   Bridge 20 reps   Straight Leg Raise 10 reps     Moist Heat Therapy   Number Minutes Moist Heat 10 Minutes   Moist Heat Location Lumbar Spine                PT Education - 09/01/17 1608    Education provided Yes   Education Details reviewed exercises.    Person(s) Educated Patient   Methods Explanation;Demonstration;Tactile cues;Verbal cues   Comprehension Verbalized understanding;Returned demonstration;Verbal cues required          PT Short Term Goals - 09/01/17 1628      PT SHORT TERM GOAL #1   Title Patient will demostrate a proper posterior pelvic tilt    Baseline needs cues,  able to do after cues   Time 4   Period Weeks   Status Achieved     PT SHORT TERM GOAL #2   Title Patient will demsotrate full pain free passive hip mobility    Baseline No end range pain this visit    Time  4   Period Weeks   Status Achieved     PT SHORT TERM GOAL #3   Title Patient will be independent with initial HEP    Time 4   Period Weeks   Status Achieved           PT Long Term Goals - 09/01/17 1627      PT LONG TERM GOAL #1   Title Patient will stand a twork for 45 minutes without self report of increased pain    Baseline improved ability to stand at work    Time 8   Period Weeks   Status Achieved     PT LONG TERM GOAL #2   Title Patient will sit for 1/2 hour without increased pain in order to drive    Baseline Sitting at work [employer] no pain    Time 8   Period Weeks   Status Achieved     PT LONG TERM GOAL #3   Title Patient will demsotrate a 38% limitation on FOTO    Baseline 31% limitation    Period Weeks   Status Achieved     PT LONG TERM GOAL #4   Title Patient will demsotrate 5/5 gross bilateral lower extremity strength in order to imporve ability ot perfrom daily tasks    Baseline  improved strength to 5/5 gross    Time 8   Period Weeks   Status Achieved               Plan - 09/01/17 1612    Clinical Impression Statement Patient has reached allgoals for therapy. She continues to have pain at work. She may benefit from a TENS unit. She will cone for 1 more visit for a tens unit fitting. Her FOTO score has improved significantly. She woill continue her exercises at home.    Clinical Presentation Evolving   Clinical Decision Making Moderate   Rehab Potential Good   PT Frequency 2x / week   PT Duration 8 weeks   PT Treatment/Interventions ADLs/Self Care Home Management;Cryotherapy;Electrical Stimulation;Iontophoresis 14m/ml Dexamethasone;Gait training;Moist Heat;Traction;Therapeutic activities;Therapeutic exercise;Neuromuscular re-education;Splinting;Taping;Dry needling;Passive range of motion;Manual techniques;Patient/family education   PT Next Visit Plan  review light core strengthening with emphasis on PPT exercises. Consdier LAD; May benefit form manual therapy. consider side plank and quadriped   PT Home Exercise Plan TENS fitting/ D/C to HEP    Consulted and Agree with Plan of Care Patient      Patient will benefit from skilled therapeutic intervention in order to improve the following deficits and impairments:     Visit Diagnosis: Chronic bilateral low back pain without sciatica  Muscle spasm of back  Abnormal posture     Problem List Patient Active Problem List   Diagnosis Date Noted  . Neutropenia ([place] 08/27/2017  . GERD (gastroesophageal reflux disease) 08/27/2017  . Spondyloarthropathy ([place] 08/27/2017  . Allergic rhinitis 08/27/2017  . Iritis 08/27/2017  . 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 Low back pain 08/27/2017  . Hyperlipidemia   . Arthritis   . Iron deficiency anemia     DCarney LivingPT DPT  09/01/2017, 4:30 PM  [facility]GBarahona [place]  [address]Phone: [phone]  Fax:  [phone] Name: Jennifer SpiewakMRN: [id number] of Birth: 925-Jan-1967

## 2017-09-07 ENCOUNTER — Ambulatory Visit: Payer: 59 | Attending: Physical Medicine and Rehabilitation | Admitting: Physical Therapy

## 2017-09-07 DIAGNOSIS — M62830 Muscle spasm of back: Secondary | ICD-10-CM | POA: Diagnosis not present

## 2017-09-07 DIAGNOSIS — M545 Low back pain: Secondary | ICD-10-CM | POA: Diagnosis not present

## 2017-09-07 DIAGNOSIS — G8929 Other chronic pain: Secondary | ICD-10-CM | POA: Insufficient documentation

## 2017-09-07 DIAGNOSIS — R293 Abnormal posture: Secondary | ICD-10-CM | POA: Diagnosis not present

## 2017-09-08 ENCOUNTER — Encounter: Payer: Self-pay | Admitting: Physical Therapy

## 2017-09-08 NOTE — Therapy (Signed)
[place], [place], [address] Phone: [phone]   Fax:  [phone]  Physical Therapy Treatment  Patient Details  Name: Jennifer Webb MRN: [id number] Date of Birth: 01/22/1966 Referring Provider: Dr Laroy Apple    Encounter Date: 09/07/2017  PT End of Session - 09/08/17 1552    Visit Number  10    Number of Visits  16    Date for PT Re-Evaluation  09/25/17    PT Start Time  3582    PT Stop Time  1610    PT Time Calculation (min)  25 min    Activity Tolerance  Treatment limited secondary to medical complications (Comment)    Behavior During Therapy  [facility] for tasks assessed/performed       Past Medical History:  Diagnosis Date  . Allergic rhinitis 08/27/2017  . Arthritis    Rheumatiod  . GERD (gastroesophageal reflux disease) 08/27/2017  . High cholesterol   . 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 Iritis 08/27/2017  . Iron deficiency anemia   . Iron deficiency anemia   . Low back pain 08/27/2017  . Neutropenia ([place]) 08/27/2017  . 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 [place]   x 2  . DILATION AND CURETTAGE OF UTERUS  1986  . 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  Subjective Assessment - 09/08/17 1548    Subjective  Patient to be seen for a TENS fitting. She has been doing her stretches and exercises.     Limitations  Walking    Patient Stated Goals  Patient will have less pain in her back     Currently in Pain?  No/denies    Pain Score  4     Pain Location  Back    Pain Orientation  Lower    Pain Descriptors / Indicators  Aching    Pain Type  Acute pain    Pain Onset  More than a month ago    Pain Frequency  Constant    Aggravating Factors   busy day at work     Pain Relieving Factors  heating pad    Effect of Pain on Daily Activities   difficulty                       [facility] Adult PT Treatment/Exercise - 09/08/17 0001      Self-Care   Self-Care  Other Self-Care Comments    Other Self-Care Comments   reviewed tens set up/ fitting/ thoeory/ and safe use              PT Education - 09/08/17 1551    Education provided  Yes    Education Details  reviewed tens theory and fitting     Person(s) Educated  Patient    Methods  Explanation;Demonstration;Tactile cues;Verbal cues    Comprehension  Verbalized understanding;Returned demonstration;Verbal cues required       PT Short Term Goals - 09/01/17 1628      PT SHORT TERM GOAL #1   Title  Patient will demostrate a proper posterior pelvic tilt     Baseline  needs cues,  able to do after cues    Time  4    Period  Weeks    Status  Achieved  PT SHORT TERM GOAL #2   Title  Patient will demsotrate full pain free passive hip mobility     Baseline  No end range pain this visit     Time  4    Period  Weeks    Status  Achieved      PT SHORT TERM GOAL #3   Title  Patient will be independent with initial HEP     Time  4    Period  Weeks    Status  Achieved        PT Long Term Goals - 09/01/17 1627      PT LONG TERM GOAL #1   Title  Patient will stand a twork for 45 minutes without self report of increased pain     Baseline  improved ability to stand at work     Time  8    Period  Weeks    Status  Achieved      PT LONG TERM GOAL #2   Title  Patient will sit for 1/2 hour without increased pain in order to drive     Baseline  Sitting at work [employer] no pain     Time  8    Period  Weeks    Status  Achieved      PT LONG TERM GOAL #3   Title  Patient will demsotrate a 38% limitation on FOTO     Baseline  31% limitation     Period  Weeks    Status  Achieved      PT LONG TERM GOAL #4   Title  Patient will demsotrate 5/5 gross bilateral lower extremity strength in order to imporve ability ot perfrom daily tasks     Baseline  improved  strength to 5/5 gross     Time  8    Period  Weeks    Status  Achieved            Plan - 09/07/17 1612    Clinical Impression Statement  Patient demostrated independent use of the tens unit. She don and doffed it without cuing. She will be discharge dto HEP.     Clinical Presentation  Evolving    Clinical Decision Making  Moderate    Rehab Potential  Good    PT Frequency  2x / week    PT Duration  8 weeks    PT Treatment/Interventions  ADLs/Self Care Home Management;Cryotherapy;Electrical Stimulation;Iontophoresis 81m/ml Dexamethasone;Gait training;Moist Heat;Traction;Therapeutic activities;Therapeutic exercise;Neuromuscular re-education;Splinting;Taping;Dry needling;Passive range of motion;Manual techniques;Patient/family education    PT Next Visit Plan   review light core strengthening with emphasis on PPT exercises. Consdier LAD; May benefit form manual therapy. consider side plank and quadriped    PT Home Exercise Plan   D/C to HEP     Consulted and Agree with Plan of Care  Patient       Patient will benefit from skilled therapeutic intervention in order to improve the following deficits and impairments:     Visit Diagnosis: Chronic bilateral low back pain without sciatica  Muscle spasm of back  Abnormal posture    PHYSICAL THERAPY DISCHARGE SUMMARY  Visits from Start of Care: 10  Current functional level related to goals / functional outcomes: Continued pain at work but strategies to reduce the pain    Remaining deficits: Pain when on her feet for too long    Education / Equipment: HEP  Plan: Patient agrees to discharge.  Patient goals were not met. Patient is being  discharged due to meeting the stated rehab goals.  ?????      Problem List Patient Active Problem List   Diagnosis Date Noted  . Neutropenia ([place]) 08/27/2017  . GERD (gastroesophageal reflux disease) 08/27/2017  . 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 Allergic rhinitis 08/27/2017  .  Iritis 08/27/2017  . 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 Low back pain 08/27/2017  . Hyperlipidemia   . Arthritis   . Iron deficiency anemia     Carney Living PT DPT  09/08/2017, 3:57 PM  [facility] [address] [place], [place], [address] Phone: [phone]   Fax:  [phone]  Name: Jennifer Webb MRN: [id number] Date of Birth: Feb 01, 1966

## 2017-09-10 DIAGNOSIS — M5127 Other intervertebral disc displacement, lumbosacral region: Secondary | ICD-10-CM | POA: Diagnosis not present

## 2017-09-10 DIAGNOSIS — M47817 Spondylosis without myelopathy or radiculopathy, lumbosacral region: Secondary | ICD-10-CM | POA: Diagnosis not present

## 2017-09-10 DIAGNOSIS — M545 Low back pain: Secondary | ICD-10-CM | POA: Diagnosis not present

## 2017-09-18 DIAGNOSIS — M5127 Other intervertebral disc displacement, lumbosacral region: Secondary | ICD-10-CM | POA: Diagnosis not present

## 2017-09-18 DIAGNOSIS — M545 Low back pain: Secondary | ICD-10-CM | POA: Diagnosis not present

## 2017-10-13 DIAGNOSIS — M5127 Other intervertebral disc displacement, lumbosacral region: Secondary | ICD-10-CM | POA: Diagnosis not present

## 2017-10-13 DIAGNOSIS — M545 Low back pain: Secondary | ICD-10-CM | POA: Diagnosis not present

## 2017-10-22 DIAGNOSIS — M456 Ankylosing spondylitis lumbar region: Secondary | ICD-10-CM | POA: Diagnosis not present

## 2017-10-22 DIAGNOSIS — Z6837 Body mass index (BMI) 37.0-37.9, adult: Secondary | ICD-10-CM | POA: Diagnosis not present

## 2017-10-22 DIAGNOSIS — M1712 Unilateral primary osteoarthritis, left knee: Secondary | ICD-10-CM | POA: Diagnosis not present

## 2017-10-22 DIAGNOSIS — Z1589 Genetic susceptibility to other disease: Secondary | ICD-10-CM | POA: Diagnosis not present

## 2017-10-22 DIAGNOSIS — H209 Unspecified iridocyclitis: Secondary | ICD-10-CM | POA: Diagnosis not present

## 2017-10-22 DIAGNOSIS — M25562 Pain in left knee: Secondary | ICD-10-CM | POA: Diagnosis not present

## 2017-10-22 DIAGNOSIS — E669 Obesity, unspecified: Secondary | ICD-10-CM | POA: Diagnosis not present

## 2017-10-22 MED FILL — DICLOFENAC SODIUM 75 MG TAB: 75 | 30 days supply | Qty: 60 | Fill #0

## 2017-10-23 ENCOUNTER — Other Ambulatory Visit: Payer: Self-pay | Admitting: Pharmacist

## 2017-10-23 MED ORDER — ADALIMUMAB 40 MG/0.8ML ~~LOC~~ PSKT: 0.8000 mL | PREFILLED_SYRINGE | SUBCUTANEOUS | 5 refills | Status: DC

## 2017-10-23 MED FILL — HUMIRA 40 MG/0.8ML PSKT: 40 | 28 days supply | Qty: 2 | Fill #0

## 2017-10-23 NOTE — Telephone Encounter (Signed)
Called patient to schedule an appointment for the [facility]. I was unable to reach the patient so I left a HIPAA-compliant message requesting that the patient return my call.   

## 2017-10-29 DIAGNOSIS — M5127 Other intervertebral disc displacement, lumbosacral region: Secondary | ICD-10-CM | POA: Diagnosis not present

## 2017-10-29 DIAGNOSIS — M47817 Spondylosis without myelopathy or radiculopathy, lumbosacral region: Secondary | ICD-10-CM | POA: Diagnosis not present

## 2017-10-29 DIAGNOSIS — M545 Low back pain: Secondary | ICD-10-CM | POA: Diagnosis not present

## 2017-11-20 ENCOUNTER — Ambulatory Visit (HOSPITAL_BASED_OUTPATIENT_CLINIC_OR_DEPARTMENT_OTHER): Payer: 59 | Admitting: Pharmacist

## 2017-11-20 DIAGNOSIS — Z79899 Other long term (current) drug therapy: Secondary | ICD-10-CM

## 2017-11-20 NOTE — Progress Notes (Signed)
   S: Patient presents today to the [facility].  Patient is currently taking Humira for rheumatoid arthritis. Patient is managed by Dr. Kathi Ludwig for this.   Adherence: denies any missed doses  Rheumatoid arthritis: SubQ: 40 mg every other week   Efficacy: she reports that it is working well. She does have some back pain but that is about it.   Drug-drug interactions: none  Screening: TB test: completed per patient Hepatitis: completed per patient  Monitoring: S/sx of infection: denies CBC: monitored by Dr. Kathi Ludwig. Labs have been within WNL per her report. S/sx of hypersensitivity: denies S/sx of malignancy: denies S/sx of heart failure: denies    O:     Lab Results  Component Value Date   WBC 4.1 02/08/2013   HGB 12.1 02/08/2013   HCT 37.1 02/08/2013   MCV 78.9 02/08/2013   PLT 308 02/08/2013      Chemistry      Component Value Date/Time   NA 136 02/08/2013 0800   K 4.0 02/08/2013 0800   CL 104 02/08/2013 0800   CO2 24 02/08/2013 0800   BUN 11 02/08/2013 0800   CREATININE 0.71 02/08/2013 0800      Component Value Date/Time   CALCIUM 9.3 02/08/2013 0800   ALKPHOS 69 02/08/2013 0800   AST 25 02/08/2013 0800   ALT 17 02/08/2013 0800   BILITOT 0.3 02/08/2013 0800     Reviewed 10/2017 labs per K[facility]: WNL  A/P: 1. Medication review: patient on Humira for RA and tolerating it well with no adverse effects and improved control of RA. Reviewed the medication with her, including the following: Humira is a TNF blocking agent indicated for ankylosing spondylitis, Crohn's disease, Hidradenitis suppurativa, psoriatic arthritis, plaque psoriasis, ulcerative colitis, and uveitis. The most common adverse effects are infections, headache, and injection site reactions. There is the possibility of an increased risk of malignancy but it is not well understood if this increased risk is due to there medication or the disease state. There  are rare cases of pancytopenia and aplastic anemia. No recommendations for changes.    Alvino Blood, PharmD, BCPS, BCACP, CPP Clinical Pharmacist Practitioner  [facility] [phone]

## 2017-11-23 DIAGNOSIS — M545 Low back pain: Secondary | ICD-10-CM | POA: Diagnosis not present

## 2017-11-23 DIAGNOSIS — M5127 Other intervertebral disc displacement, lumbosacral region: Secondary | ICD-10-CM | POA: Diagnosis not present

## 2017-11-23 DIAGNOSIS — M47817 Spondylosis without myelopathy or radiculopathy, lumbosacral region: Secondary | ICD-10-CM | POA: Diagnosis not present

## 2017-11-23 MED FILL — HUMIRA 40 MG/0.8ML PSKT: 40 | 28 days supply | Qty: 2 | Fill #1

## 2017-11-23 MED FILL — DICLOFENAC SODIUM 75 MG TAB: 75 | 30 days supply | Qty: 60 | Fill #0

## 2017-12-02 ENCOUNTER — Telehealth: Payer: Self-pay | Admitting: Family Medicine

## 2017-12-02 NOTE — Telephone Encounter (Signed)
Pt called and is requesting a referral to a cardiologist states you and her discussed this at her last visit pt can be reached at [phone] informed pt that you was out of the office today

## 2017-12-02 NOTE — Telephone Encounter (Signed)
I will need to see her in order to put in a referral. Not sure why she is wanting to see a cardiologist at this point but we can discuss this at her visit.

## 2017-12-03 NOTE — Telephone Encounter (Signed)
Left message for pt to call back and schedule an appt to discuss this

## 2017-12-04 DIAGNOSIS — M545 Low back pain: Secondary | ICD-10-CM | POA: Diagnosis not present

## 2017-12-04 DIAGNOSIS — M47817 Spondylosis without myelopathy or radiculopathy, lumbosacral region: Secondary | ICD-10-CM | POA: Diagnosis not present

## 2017-12-09 ENCOUNTER — Ambulatory Visit
Admission: RE | Admit: 2017-12-09 | Discharge: 2017-12-09 | Disposition: A | Discharge status: Home / Self Care | Payer: 59 | Source: Ambulatory Visit | Attending: Family Medicine | Admitting: Family Medicine

## 2017-12-09 ENCOUNTER — Ambulatory Visit (INDEPENDENT_AMBULATORY_CARE_PROVIDER_SITE_OTHER): Payer: 59 | Admitting: Family Medicine

## 2017-12-09 ENCOUNTER — Encounter: Payer: Self-pay | Admitting: Family Medicine

## 2017-12-09 VITALS — BP 138/90 | HR 60 | Temp 97.8°F | Resp 16 | Wt 229.2 lb

## 2017-12-09 DIAGNOSIS — R002 Palpitations: Secondary | ICD-10-CM

## 2017-12-09 DIAGNOSIS — R0609 Other forms of dyspnea: Principal | ICD-10-CM

## 2017-12-09 DIAGNOSIS — Z862 Personal history of diseases of the blood and blood-forming organs and certain disorders involving the immune mechanism: Secondary | ICD-10-CM

## 2017-12-09 DIAGNOSIS — R1310 Dysphagia, unspecified: Secondary | ICD-10-CM

## 2017-12-09 DIAGNOSIS — R0600 Dyspnea, unspecified: Secondary | ICD-10-CM

## 2017-12-09 DIAGNOSIS — R9431 Abnormal electrocardiogram [ECG] [EKG]: Secondary | ICD-10-CM

## 2017-12-09 DIAGNOSIS — I10 Essential (primary) hypertension: Secondary | ICD-10-CM | POA: Diagnosis not present

## 2017-12-09 MED ORDER — LISINOPRIL 5 MG PO TABS: 5.0000 mg | ORAL_TABLET | Freq: Every day | ORAL | 1 refills | Status: DC

## 2017-12-09 MED FILL — LISINOPRIL 5 MG TABLET: 5 | 30 days supply | Qty: 30 | Fill #0

## 2017-12-09 NOTE — Patient Instructions (Addendum)
You appear to have hypertension and I am starting you on a low dose medication today.   Start checking your blood pressure at home and keep a record of these.   I am sending you to cardiology for further evaluation.   We will call you with your lab results and XR result.   If you develop chest pain, worsening shortness of breath or any other symptoms then go to the emergency room.    DASH Eating Plan DASH stands for "Dietary Approaches to Stop Hypertension." The DASH eating plan is a healthy eating plan that has been shown to reduce high blood pressure (hypertension). It may also reduce your risk for type 2 diabetes, heart disease, and stroke. The DASH eating plan may also help with weight loss. What are tips for following this plan? General guidelines  Avoid eating more than 2,300 mg (milligrams) of salt (sodium) a day. If you have hypertension, you may need to reduce your sodium intake to 1,500 mg a day.  Limit alcohol intake to no more than 1 drink a day for nonpregnant women and 2 drinks a day for men. One drink equals 12 oz of beer, 5 oz of wine, or 1 oz of hard liquor.  Work with your health care provider to maintain a healthy body weight or to lose weight. Ask what an ideal weight is for you.  Get at least 30 minutes of exercise that causes your heart to beat faster (aerobic exercise) most days of the week. Activities may include walking, swimming, or biking.  Work with your health care provider or diet and nutrition specialist (dietitian) to adjust your eating plan to your individual calorie needs. Reading food labels  Check food labels for the amount of sodium per serving. Choose foods with less than 5 percent of the Daily Value of sodium. Generally, foods with less than 300 mg of sodium per serving fit into this eating plan.  To find whole grains, look for the word "whole" as the first word in the ingredient list. Shopping  Buy products labeled as "low-sodium" or "no salt  added."  Buy fresh foods. Avoid canned foods and premade or frozen meals. Cooking  Avoid adding salt when cooking. Use salt-free seasonings or herbs instead of table salt or sea salt. Check with your health care provider or pharmacist before using salt substitutes.  Do not fry foods. Cook foods using healthy methods such as baking, boiling, grilling, and broiling instead.  Cook with heart-healthy oils, such as olive, canola, soybean, or sunflower oil. Meal planning   Eat a balanced diet that includes: ? 5 or more servings of fruits and vegetables each day. At each meal, try to fill half of your plate with fruits and vegetables. ? Up to 6-8 servings of whole grains each day. ? Less than 6 oz of lean meat, poultry, or fish each day. A 3-oz serving of meat is about the same size as a deck of cards. One egg equals 1 oz. ? 2 servings of low-fat dairy each day. ? A serving of nuts, seeds, or beans 5 times each week. ? Heart-healthy fats. Healthy fats called Omega-3 fatty acids are found in foods such as flaxseeds and coldwater fish, like sardines, salmon, and mackerel.  Limit how much you eat of the following: ? Canned or prepackaged foods. ? Food that is high in trans fat, such as fried foods. ? Food that is high in saturated fat, such as fatty meat. ? Sweets, desserts, sugary drinks,  and other foods with added sugar. ? Full-fat dairy products.  Do not salt foods before eating.  Try to eat at least 2 vegetarian meals each week.  Eat more home-cooked food and less restaurant, buffet, and fast food.  When eating at a restaurant, ask that your food be prepared with less salt or no salt, if possible. What foods are recommended? The items listed may not be a complete list. Talk with your dietitian about what dietary choices are best for you. Grains Whole-grain or whole-wheat bread. Whole-grain or whole-wheat pasta. Brown rice. [place]. Bulgur. Whole-grain and low-sodium cereals.  Pita bread. Low-fat, low-sodium crackers. Whole-wheat flour tortillas. Vegetables Fresh or frozen vegetables (raw, steamed, roasted, or grilled). Low-sodium or reduced-sodium tomato and vegetable juice. Low-sodium or reduced-sodium tomato sauce and tomato paste. Low-sodium or reduced-sodium canned vegetables. Fruits All fresh, dried, or frozen fruit. Canned fruit in natural juice (without added sugar). Meat and other protein foods Skinless chicken or [place]. Ground chicken or [place]. Pork with fat trimmed off. Fish and seafood. Egg whites. Dried beans, peas, or lentils. Unsalted nuts, nut butters, and seeds. Unsalted canned beans. Lean cuts of beef with fat trimmed off. Low-sodium, lean deli meat. Dairy Low-fat (1%) or fat-free (skim) milk. Fat-free, low-fat, or reduced-fat cheeses. Nonfat, low-sodium ricotta or cottage cheese. Low-fat or nonfat yogurt. Low-fat, low-sodium cheese. Fats and oils Soft margarine without trans fats. Vegetable oil. Low-fat, reduced-fat, or light mayonnaise and salad dressings (reduced-sodium). Canola, safflower, olive, soybean, and sunflower oils. Avocado. Seasoning and other foods Herbs. Spices. Seasoning mixes without salt. Unsalted popcorn and pretzels. Fat-free sweets. What foods are not recommended? The items listed may not be a complete list. Talk with your dietitian about what dietary choices are best for you. Grains Baked goods made with fat, such as croissants, muffins, or some breads. Dry pasta or rice meal packs. Vegetables Creamed or fried vegetables. Vegetables in a cheese sauce. Regular canned vegetables (not low-sodium or reduced-sodium). Regular canned tomato sauce and paste (not low-sodium or reduced-sodium). Regular tomato and vegetable juice (not low-sodium or reduced-sodium). Angie Fava. Olives. Fruits Canned fruit in a light or heavy syrup. Fried fruit. Fruit in cream or butter sauce. Meat and other protein foods Fatty cuts of meat. Ribs. Fried  meat. Berniece Salines. Sausage. Bologna and other processed lunch meats. Salami. Fatback. Hotdogs. Bratwurst. Salted nuts and seeds. Canned beans with added salt. Canned or smoked fish. Whole eggs or egg yolks. Chicken or [place] with skin. Dairy Whole or 2% milk, cream, and half-and-half. Whole or full-fat cream cheese. Whole-fat or sweetened yogurt. Full-fat cheese. Nondairy creamers. Whipped toppings. Processed cheese and cheese spreads. Fats and oils Butter. Stick margarine. Lard. Shortening. Ghee. Bacon fat. Tropical oils, such as coconut, palm kernel, or palm oil. Seasoning and other foods Salted popcorn and pretzels. Onion salt, garlic salt, seasoned salt, table salt, and sea salt. Worcestershire sauce. Tartar sauce. Barbecue sauce. Teriyaki sauce. Soy sauce, including reduced-sodium. Steak sauce. Canned and packaged gravies. Fish sauce. Oyster sauce. Cocktail sauce. Horseradish that you find on the shelf. Ketchup. Mustard. Meat flavorings and tenderizers. Bouillon cubes. Hot sauce and Tabasco sauce. Premade or packaged marinades. Premade or packaged taco seasonings. Relishes. Regular salad dressings. Where to find more information:  National Heart, Lung, and [place]: https://wilson-eaton.com/  American Heart Association: www.heart.org Summary  The DASH eating plan is a healthy eating plan that has been shown to reduce high blood pressure (hypertension). It may also reduce your risk for type 2 diabetes, heart disease, and stroke.  With the DASH eating plan, you should limit salt (sodium) intake to 2,300 mg a day. If you have hypertension, you may need to reduce your sodium intake to 1,500 mg a day.  When on the DASH eating plan, aim to eat more fresh fruits and vegetables, whole grains, lean proteins, low-fat dairy, and heart-healthy fats.  Work with your health care provider or diet and nutrition specialist (dietitian) to adjust your eating plan to your individual calorie needs. This information is  not intended to replace advice given to you by your health care provider. Make sure you discuss any questions you have with your health care provider. Document Released: 10/09/2011 Document Revised: 10/13/2016 Document Reviewed: 10/13/2016 Elsevier Interactive Patient Education  [employer].

## 2017-12-09 NOTE — Progress Notes (Signed)
Subjective:    Patient ID: Jennifer Webb, female    DOB: 05-01-1966, 52 y.o.   MRN: [id number]  HPI Chief Complaint  Patient presents with  . SOB    SOB and fatigue but no chest pain.    She is here a 52 year old african [place] female with a history of RA, anemia, obesity and fairly new diagnosis of HTN that has never been treated per patient. She complains of a 2 week history of DOE and more recently dyspnea at rest. She also reports having intermittent "fluttering" in her chest lasting 2-3 minutes at a time. States she has fatigue and nausea at times as well. She is requesting a referral to cardiology.  Denies history of cardiac or pulmonary disease. Denies ever having an EKG.   Denies chest pain, leg pain. Denies fever, chills, dizziness, chest pain, palpitations, abdominal pain, V/D, urinary symptoms, LE edema. No cough, orthopnea or PND.   No recent surgeries, immobilization. No history of blood clot or bleeding disorders.   Reports MGM had a heart attack in her 25s. No other family history of heart disease.   States bowel movements are normal. No blood or pus.  Denies bleeding.   On a different note, she also reports difficulty swallowing her food, especially met at times. States she feels like it gets stuck. Denies history of reflux. States she had an EGD and colonoscopy 3 years ago with Dr. Collene Mares for IDA. Takes iron daily.   Dr. Lenna Gilford is her rheumatolgoist and treating her for RA with Humira.   Reviewed allergies, medications, past medical, surgical, family, and social history.    Review of Systems Pertinent positives and negatives in the history of present illness.     Objective:   Physical Exam BP 138/90   Pulse 60   Temp 97.8 F (36.6 C) (Oral)   Resp 16   Wt 229 lb 3.2 oz (104 kg)   SpO2 99%   BMI 37.56 kg/m   Alert and in no distress.  Pharyngeal area is normal. Neck is supple without adenopathy or thyromegaly. Cardiac exam shows a regular sinus rhythm  without murmurs or gallops. Lungs are clear to auscultation. Extremities without edema, normal pulses, calves are symmetric, non tender, no palpable cord and negative Homan's sign. PERRLA, CN II-IX intact. Skin warm and dry, no pallor.       Assessment & Plan:  Dyspnea on exertion - Plan: EKG 12-Lead, CBC with Differential/Platelet, Comprehensive metabolic panel, TSH, DG Chest 2 View, Brain natriuretic peptide, Ambulatory referral to Cardiology  Intermittent palpitations - Plan: EKG 12-Lead, CBC with Differential/Platelet, Comprehensive metabolic panel, TSH, DG Chest 2 View, Ambulatory referral to Cardiology  History of anemia - Plan: CBC with Differential/Platelet, Iron, TIBC and Ferritin Panel  Dysphagia, unspecified type  Essential hypertension - new onset. started on medication 12/09/17 - Plan: lisinopril (PRINIVIL,ZESTRIL) 5 MG tablet, Ambulatory referral to Cardiology  Abnormal ECG - Plan: Ambulatory referral to Cardiology  Vitals are wnl. Ambulated patient around the office and her oxygen level never dropped below 97% and her heart rate maintained in the 90s.  Discussed that she does not appear to be having an acute cardiac or pulmonary event.  Considered PE but this does not appear to be the case.  She has had several elevated BP readings. I am starting her on a low dose medication and will have her keep an eye on her BP at home. 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  Educated her on [place] and handout was given.  ECG is unremarkable. She would like to see a cardiologist and I am ok with this referral.  Discussed risk factors for heart disease and her risks are low. She has never smoked.  Recommend she follow up with her GI for dysphagia.  I will see her back in 4 weeks for HTN follow up.

## 2017-12-10 LAB — COMPREHENSIVE METABOLIC PANEL
ALK PHOS: 59 IU/L (ref 39–117)
ALT: 15 IU/L (ref 0–32)
AST: 23 IU/L (ref 0–40)
Albumin/Globulin Ratio: 1.3 (ref 1.2–2.2)
Albumin: 4.4 g/dL (ref 3.5–5.5)
BUN/Creatinine Ratio: 25 — ABNORMAL HIGH (ref 9–23)
BUN: 19 mg/dL (ref 6–24)
Bilirubin Total: 0.4 mg/dL (ref 0.0–1.2)
CO2: 21 mmol/L (ref 20–29)
CREATININE: 0.75 mg/dL (ref 0.57–1.00)
Calcium: 10.2 mg/dL (ref 8.7–10.2)
Chloride: 102 mmol/L (ref 96–106)
GFR calc Af Amer: 105 mL/min/{1.73_m2} (ref >59)
GFR calc non Af Amer: 91 mL/min/{1.73_m2} (ref >59)
GLUCOSE: 74 mg/dL (ref 65–99)
Globulin, Total: 3.3 g/dL (ref 1.5–4.5)
Potassium: 4.4 mmol/L (ref 3.5–5.2)
SODIUM: 143 mmol/L (ref 134–144)
Total Protein: 7.7 g/dL (ref 6.0–8.5)

## 2017-12-10 LAB — IRON,TIBC AND FERRITIN PANEL
Ferritin: 197 ng/mL — ABNORMAL HIGH (ref 15–150)
IRON SATURATION: 26 % (ref 15–55)
IRON: 80 ug/dL (ref 27–159)
TIBC: 308 ug/dL (ref 250–450)
UIBC: 228 ug/dL (ref 131–425)

## 2017-12-10 LAB — CBC WITH DIFFERENTIAL/PLATELET
BASOS ABS: 0 10*3/uL (ref 0.0–0.2)
Basos: 1 %
EOS (ABSOLUTE): 0 10*3/uL (ref 0.0–0.4)
Eos: 1 %
Hematocrit: 37.8 % (ref 34.0–46.6)
Hemoglobin: 12.1 g/dL (ref 11.1–15.9)
Immature Grans (Abs): 0 10*3/uL (ref 0.0–0.1)
Immature Granulocytes: 0 %
Lymphocytes Absolute: 2.2 10*3/uL (ref 0.7–3.1)
Lymphs: 50 %
MCH: 26.5 pg — ABNORMAL LOW (ref 26.6–33.0)
MCHC: 32 g/dL (ref 31.5–35.7)
MCV: 83 fL (ref 79–97)
MONOS ABS: 0.3 10*3/uL (ref 0.1–0.9)
Monocytes: 7 %
Neutrophils Absolute: 1.8 10*3/uL (ref 1.4–7.0)
Neutrophils: 41 %
PLATELETS: 324 10*3/uL (ref 150–379)
RBC: 4.56 x10E6/uL (ref 3.77–5.28)
RDW: 14.8 % (ref 12.3–15.4)
WBC: 4.4 10*3/uL (ref 3.4–10.8)

## 2017-12-10 LAB — TSH: TSH: 0.921 u[IU]/mL (ref 0.450–4.500)

## 2017-12-10 LAB — BRAIN NATRIURETIC PEPTIDE: BNP: 5.7 pg/mL (ref 0.0–100.0)

## 2017-12-17 ENCOUNTER — Encounter: Payer: Self-pay | Admitting: Internal Medicine

## 2017-12-21 DIAGNOSIS — M545 Low back pain: Secondary | ICD-10-CM | POA: Diagnosis not present

## 2017-12-21 DIAGNOSIS — M47817 Spondylosis without myelopathy or radiculopathy, lumbosacral region: Secondary | ICD-10-CM | POA: Diagnosis not present

## 2017-12-21 MED FILL — HUMIRA 40 MG/0.8ML PSKT: 40 | 28 days supply | Qty: 2 | Fill #2

## 2018-01-07 NOTE — Progress Notes (Signed)
Cardiology Office Note   Date:  01/08/2018   ID:  Jennifer Webb, Jennifer Webb Dec 30, 1965, MRN [id number]  PCP:  Girtha Rm, NP-C  Cardiologist:   Aissatou Fronczak [place], MD   Chief Complaint  Patient presents with  . Shortness of Breath      History of Present Illness: Jennifer Webb is a 52 y.o. female who is seen at the request of Harland Dingwall NP-C for evaluation of dyspnea and abnormal Ecg. She has a history of HTN. HLD, anemia, obesity, and RA. 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 States RA in good control on Humira. Has some low back deterioration for which she gets back injections periodically. She reports a 4-6 week history of shortness of breath. This is random and not associated with exertion. No cough, edema, orthopnea. Was started on lisinopril after symptoms developed. She only exercises once or twice a week. She is trying to eat healthier.     Past Medical History:  Diagnosis Date  . Allergic rhinitis 08/27/2017  . Arthritis    Rheumatiod  . GERD (gastroesophageal reflux disease) 08/27/2017  . High cholesterol   . 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 Iritis 08/27/2017  . Iron deficiency anemia   . Iron deficiency anemia   . Low back pain 08/27/2017  . Neutropenia ([place]) 08/27/2017  . 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 [place]   x 2  . DILATION AND CURETTAGE OF UTERUS  1986  . 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 Adalimumab (HUMIRA) 40 MG/0.8ML PSKT Inject 0.8 mLs (40 mg total) into the skin every 14 (fourteen) days. 2 each 5  . Calcium Carbonate-Vitamin D (CALCIUM 600+D PO) Take 1 tablet by mouth daily.    . diclofenac (VOLTAREN) 75 MG EC tablet Take 75 mg by mouth 2 (two) times daily.    Marland Kitchen lisinopril (PRINIVIL,ZESTRIL) 5 MG tablet Take 1 tablet  (5 mg total) by mouth daily. 30 tablet 1  . Multiple Vitamin (CALCIUM COMPLEX PO) Take by mouth.    . Multiple Vitamin (MULTIVITAMIN) tablet Take 1 tablet by mouth daily.     No current facility-administered medications for this visit.     Allergies:   Iodine and Other    Social History:  The patient  reports that  has never smoked. she has never used smokeless tobacco. She reports that she drinks alcohol. She reports that she does not use drugs.   Family History:  The patient's family history includes Diabetes in her maternal grandmother and mother; Heart attack in her maternal aunt and maternal grandmother; Hypertension in her father and mother; Lung cancer in her father; Pancreatic cancer in her brother.    ROS:  Please see the history of present illness.   Otherwise, review of systems are positive for none.   All other systems are reviewed and negative.    PHYSICAL EXAM: VS:  BP 124/82 (BP Location: Left Arm)   Pulse 63   Ht '5\' 6"'  (1.676 m)   Wt 227 lb 12.8 oz (103.3 kg)   BMI 36.77 kg/m  , BMI Body mass index is 36.77 kg/m. GEN: Well nourished, obese, in no acute distress  HEENT: normal  Neck: no JVD, carotid bruits, or masses Cardiac: RRR; no murmurs,  rubs, or gallops,no edema  Respiratory:  clear to auscultation bilaterally, normal work of breathing GI: soft, nontender, nondistended, + BS MS: no deformity or atrophy  Skin: warm and dry, no rash Neuro:  Strength and sensation are intact Psych: euthymic mood, full affect   EKG:   The ekg ordered 12/09/17 demonstrates NSR rate 73. LAD, possible LVH. I have personally reviewed and interpreted this study.    Recent Labs: 12/09/2017: ALT 15; BNP 5.7; BUN 19; Creatinine, Ser 0.75; Hemoglobin 12.1; Platelets 324; Potassium 4.4; Sodium 143; TSH 0.921    Lipid Panel No results found for: CHOL, TRIG, HDL, CHOLHDL, VLDL, LDLCALC, LDLDIRECT    Wt Readings from Last 3 Encounters:  01/08/18 227 lb 12.8 oz (103.3 kg)  12/09/17  229 lb 3.2 oz (104 kg)  08/24/17 222 lb (100.7 kg)      Other studies Reviewed:  Labs dated 05/14/17: cholesterol 231, triglycerides 43, HDL 77, LDL 145.   Additional studies/ records that were reviewed today include:  CHEST  2 VIEW  COMPARISON:  February 11, 2011  FINDINGS: Lungs are clear. Heart size and pulmonary vascularity are normal. No adenopathy. There is degenerative change in the thoracic spine.  IMPRESSION: No edema or consolidation.   Electronically Signed   By: Lowella Grip III M.D.   On: 12/10/2017 07:57   ASSESSMENT AND PLAN:  1.  Shortness of breath. I suspect this is mostly related to obesity and deconditioning. Not really exertional. BNP and CXR ok. Suspect she has LVH based on Ecg and BP medication initiated. Recommend an Echo. If this is normal would focus on weight loss and increased aerobic activity.   2. HTN. Now well controlled on low dose lisinopril.  3. HLD. Work on dietary and lifestyle modification for now. If no improvement consider statin therapy.   Labs/ tests ordered today include: Echo   Signed, Aerin Delany [place], MD  01/08/2018 9:20 AM    [facility] [address] [place], [place], [address] Phone [phone], Fax [phone]

## 2018-01-07 NOTE — H&P (View-Only) (Signed)
Cardiology Office Note   Date:  01/08/2018   ID:  Jennifer, Webb 06/14/1966, MRN [id number]  PCP:  Girtha Rm, NP-C  Cardiologist:    [place], MD   Chief Complaint  Patient presents with  . Shortness of Breath      History of Present Illness: Jennifer Webb is a 52 y.o. female who is seen at the request of Jennifer Dingwall NP-C for evaluation of dyspnea and abnormal Ecg. She has a history of HTN. HLD, anemia, obesity, and RA. 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 States RA in good control on Humira. Has some low back deterioration for which she gets back injections periodically. She reports a 4-6 week history of shortness of breath. This is random and not associated with exertion. No cough, edema, orthopnea. Was started on lisinopril after symptoms developed. She only exercises once or twice a week. She is trying to eat healthier.     Past Medical History:  Diagnosis Date  . Allergic rhinitis 08/27/2017  . Arthritis    Rheumatiod  . GERD (gastroesophageal reflux disease) 08/27/2017  . High cholesterol   . 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 Iritis 08/27/2017  . Iron deficiency anemia   . Iron deficiency anemia   . Low back pain 08/27/2017  . Neutropenia ([place]) 08/27/2017  . 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 [place]   x 2  . DILATION AND CURETTAGE OF UTERUS  1986  . 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 Adalimumab (HUMIRA) 40 MG/0.8ML PSKT Inject 0.8 mLs (40 mg total) into the skin every 14 (fourteen) days. 2 each 5  . Calcium Carbonate-Vitamin D (CALCIUM 600+D PO) Take 1 tablet by mouth daily.    . diclofenac (VOLTAREN) 75 MG EC tablet Take 75 mg by mouth 2 (two) times daily.    Marland Kitchen lisinopril (PRINIVIL,ZESTRIL) 5 MG tablet Take 1 tablet  (5 mg total) by mouth daily. 30 tablet 1  . Multiple Vitamin (CALCIUM COMPLEX PO) Take by mouth.    . Multiple Vitamin (MULTIVITAMIN) tablet Take 1 tablet by mouth daily.     No current facility-administered medications for this visit.     Allergies:   Iodine and Other    Social History:  The patient  reports that  has never smoked. she has never used smokeless tobacco. She reports that she drinks alcohol. She reports that she does not use drugs.   Family History:  The patient's family history includes Diabetes in her maternal grandmother and mother; Heart attack in her maternal aunt and maternal grandmother; Hypertension in her father and mother; Lung cancer in her father; Pancreatic cancer in her brother.    ROS:  Please see the history of present illness.   Otherwise, review of systems are positive for none.   All other systems are reviewed and negative.    PHYSICAL EXAM: VS:  BP 124/82 (BP Location: Left Arm)   Pulse 63   Ht '5\' 6"'  (1.676 m)   Wt 227 lb 12.8 oz (103.3 kg)   BMI 36.77 kg/m  , BMI Body mass index is 36.77 kg/m. GEN: Well nourished, obese, in no acute distress  HEENT: normal  Neck: no JVD, carotid bruits, or masses Cardiac: RRR; no murmurs,  rubs, or gallops,no edema  Respiratory:  clear to auscultation bilaterally, normal work of breathing GI: soft, nontender, nondistended, + BS MS: no deformity or atrophy  Skin: warm and dry, no rash Neuro:  Strength and sensation are intact Psych: euthymic mood, full affect   EKG:   The ekg ordered 12/09/17 demonstrates NSR rate 73. LAD, possible LVH. I have personally reviewed and interpreted this study.    Recent Labs: 12/09/2017: ALT 15; BNP 5.7; BUN 19; Creatinine, Ser 0.75; Hemoglobin 12.1; Platelets 324; Potassium 4.4; Sodium 143; TSH 0.921    Lipid Panel No results found for: CHOL, TRIG, HDL, CHOLHDL, VLDL, LDLCALC, LDLDIRECT    Wt Readings from Last 3 Encounters:  01/08/18 227 lb 12.8 oz (103.3 kg)  12/09/17  229 lb 3.2 oz (104 kg)  08/24/17 222 lb (100.7 kg)      Other studies Reviewed:  Labs dated 05/14/17: cholesterol 231, triglycerides 43, HDL 77, LDL 145.   Additional studies/ records that were reviewed today include:  CHEST  2 VIEW  COMPARISON:  February 11, 2011  FINDINGS: Lungs are clear. Heart size and pulmonary vascularity are normal. No adenopathy. There is degenerative change in the thoracic spine.  IMPRESSION: No edema or consolidation.   Electronically Signed   By: Lowella Grip III M.D.   On: 12/10/2017 07:57   ASSESSMENT AND PLAN:  1.  Shortness of breath. I suspect this is mostly related to obesity and deconditioning. Not really exertional. BNP and CXR ok. Suspect she has LVH based on Ecg and BP medication initiated. Recommend an Echo. If this is normal would focus on weight loss and increased aerobic activity.   2. HTN. Now well controlled on low dose lisinopril.  3. HLD. Work on dietary and lifestyle modification for now. If no improvement consider statin therapy.   Labs/ tests ordered today include: Echo   Signed,  [place], MD  01/08/2018 9:20 AM    [facility] [address] [place], [place], [address] Phone [phone], Fax [phone]

## 2018-01-08 ENCOUNTER — Encounter: Payer: Self-pay | Admitting: Cardiology

## 2018-01-08 ENCOUNTER — Ambulatory Visit: Payer: 59 | Admitting: Cardiology

## 2018-01-08 VITALS — BP 124/82 | HR 63 | Ht 66.0 in | Wt 227.8 lb

## 2018-01-08 DIAGNOSIS — R0602 Shortness of breath: Secondary | ICD-10-CM

## 2018-01-08 DIAGNOSIS — I10 Essential (primary) hypertension: Secondary | ICD-10-CM | POA: Diagnosis not present

## 2018-01-08 DIAGNOSIS — M47817 Spondylosis without myelopathy or radiculopathy, lumbosacral region: Secondary | ICD-10-CM | POA: Diagnosis not present

## 2018-01-08 DIAGNOSIS — M545 Low back pain: Secondary | ICD-10-CM | POA: Diagnosis not present

## 2018-01-08 NOTE — Patient Instructions (Signed)
Schedule Echo

## 2018-01-15 MED FILL — LISINOPRIL 5 MG TABLET: 5 | 30 days supply | Qty: 30 | Fill #1

## 2018-01-15 MED FILL — HUMIRA 40 MG/0.8ML PSKT: 40 | 28 days supply | Qty: 2 | Fill #3

## 2018-01-18 ENCOUNTER — Ambulatory Visit (HOSPITAL_COMMUNITY): Payer: 59 | Attending: Cardiovascular Disease

## 2018-01-18 ENCOUNTER — Other Ambulatory Visit (HOSPITAL_COMMUNITY): Payer: 59

## 2018-01-18 ENCOUNTER — Other Ambulatory Visit: Payer: Self-pay

## 2018-01-18 DIAGNOSIS — R0602 Shortness of breath: Secondary | ICD-10-CM | POA: Insufficient documentation

## 2018-01-18 DIAGNOSIS — E785 Hyperlipidemia, unspecified: Secondary | ICD-10-CM | POA: Insufficient documentation

## 2018-01-18 DIAGNOSIS — R0600 Dyspnea, unspecified: Secondary | ICD-10-CM | POA: Diagnosis not present

## 2018-01-18 DIAGNOSIS — I10 Essential (primary) hypertension: Secondary | ICD-10-CM | POA: Diagnosis not present

## 2018-01-18 DIAGNOSIS — I340 Nonrheumatic mitral (valve) insufficiency: Secondary | ICD-10-CM | POA: Insufficient documentation

## 2018-01-22 ENCOUNTER — Other Ambulatory Visit: Payer: Self-pay

## 2018-01-22 ENCOUNTER — Telehealth: Payer: Self-pay

## 2018-01-22 DIAGNOSIS — R931 Abnormal findings on diagnostic imaging of heart and coronary circulation: Secondary | ICD-10-CM | POA: Diagnosis not present

## 2018-01-22 DIAGNOSIS — R0602 Shortness of breath: Secondary | ICD-10-CM

## 2018-01-22 MED ORDER — PREDNISONE 50 MG PO TABS: ORAL_TABLET | ORAL | 0 refills | Status: DC

## 2018-01-22 MED FILL — predniSONE 50 MG TABS: 50 | 2 days supply | Qty: 3 | Fill #0

## 2018-01-22 NOTE — Telephone Encounter (Addendum)
@  [facility] CARDIOVASCULAR DIVISION [facility] [address] [place] [address] Dept: [phone] Loc: [phone]  Sara Keys  01/22/2018  You are scheduled for a Right and Left Cardiac Cath on Wednesday 01/27/18 with Dr. [place].  1. Please arrive at the [facility] (Main Entrance A) at [facility]: [address] [place], [place] [address] at 11:00 am (two hours before your procedure to ensure your preparation). Free valet parking service is available.   Special note: Every effort is made to have your procedure done on time. Please understand that emergencies sometimes delay scheduled procedures.  2. Diet: NOTHING TO EAT OR DRINK AFTER MIDNIGHT Tuesday 01/26/18  3. Labs: bmet,cbc,pt to be done Friday 01/22/18  4. Medication instructions in preparation for your procedure:   Take Prednisone 50 mg every 6 hours for   2 doses day before cath     Take Prednisone 50 mg morning of cath       prior to arrival          Take Benadryl 50 mg with morning dose     of Prednisone prior to arrival     Take Aspirin 81 mg morning of cath   You may take medication with sips of water.  5. Plan for one night stay--bring personal belongings. 6. Bring a current list of your medications and current insurance cards. 7. You MUST have a responsible person to drive you home. 8. Someone MUST be with you the first 24 hours after you arrive home or your discharge will be delayed. 9. Please wear clothes that are easy to get on and off and wear slip-on shoes.  Thank you for allowing [place] to care for you!   -- [facility]

## 2018-01-23 LAB — BASIC METABOLIC PANEL
BUN/Creatinine Ratio: 19 (ref 9–23)
BUN: 13 mg/dL (ref 6–24)
CALCIUM: 9.4 mg/dL (ref 8.7–10.2)
CHLORIDE: 104 mmol/L (ref 96–106)
CO2: 24 mmol/L (ref 20–29)
Creatinine, Ser: 0.68 mg/dL (ref 0.57–1.00)
GFR calc Af Amer: 115 mL/min/{1.73_m2} (ref >59)
GFR calc non Af Amer: 100 mL/min/{1.73_m2} (ref >59)
Glucose: 69 mg/dL (ref 65–99)
POTASSIUM: 4.5 mmol/L (ref 3.5–5.2)
Sodium: 142 mmol/L (ref 134–144)

## 2018-01-23 LAB — CBC WITH DIFFERENTIAL/PLATELET
Basophils Absolute: 0 10*3/uL (ref 0.0–0.2)
Basos: 0 %
EOS (ABSOLUTE): 0 10*3/uL (ref 0.0–0.4)
EOS: 1 %
HEMATOCRIT: 35.3 % (ref 34.0–46.6)
Hemoglobin: 11.1 g/dL (ref 11.1–15.9)
IMMATURE GRANS (ABS): 0 10*3/uL (ref 0.0–0.1)
IMMATURE GRANULOCYTES: 0 %
LYMPHS: 40 %
Lymphocytes Absolute: 1.9 10*3/uL (ref 0.7–3.1)
MCH: 25.6 pg — ABNORMAL LOW (ref 26.6–33.0)
MCHC: 31.4 g/dL — ABNORMAL LOW (ref 31.5–35.7)
MCV: 82 fL (ref 79–97)
MONOS ABS: 0.4 10*3/uL (ref 0.1–0.9)
Monocytes: 8 %
NEUTROS PCT: 51 %
Neutrophils Absolute: 2.4 10*3/uL (ref 1.4–7.0)
PLATELETS: 325 10*3/uL (ref 150–379)
RBC: 4.33 x10E6/uL (ref 3.77–5.28)
RDW: 15.1 % (ref 12.3–15.4)
WBC: 4.8 10*3/uL (ref 3.4–10.8)

## 2018-01-23 LAB — PT AND PTT
INR: 1 (ref 0.8–1.2)
Prothrombin Time: 10.6 s (ref 9.1–12.0)
aPTT: 28 s (ref 24–33)

## 2018-01-25 ENCOUNTER — Other Ambulatory Visit: Payer: Self-pay | Admitting: Cardiology

## 2018-01-25 DIAGNOSIS — I429 Cardiomyopathy, unspecified: Secondary | ICD-10-CM

## 2018-01-26 ENCOUNTER — Telehealth: Payer: Self-pay | Admitting: *Deleted

## 2018-01-26 NOTE — Telephone Encounter (Signed)
Pt contacted pre-catheterization scheduled at [facility] for: Wednesday January 27, 2018 1 PM Verified arrival time and place: [facility] Main Entrance A/[facility] at: 11 AM Nothing to eat or drink after midnight prior to cath. Verified no diabetes medications.   AM meds can be  taken pre-cath with sip of water including: ASA 81 mg Prednisone 50 mg  Benadryl 50 mg  Confirmed Iodine allergy/instructions for 13 hour prednisone and benadryl prep: Prednisone 50 mg 12 MN 01/26/18 Prednisone 50 mg 6 AM 01/27/18 Prednisone 50 mg and Benadryl 50 mg prior at home just prior to leaving for hospital-pt advised not to drive to hospital.  Confirmed patient has responsible person to drive home post procedure and observe patient for 24 hours: yes

## 2018-01-27 ENCOUNTER — Encounter (HOSPITAL_COMMUNITY): Payer: Self-pay | Admitting: Cardiology

## 2018-01-27 ENCOUNTER — Ambulatory Visit (HOSPITAL_COMMUNITY)
Admission: RE | Admit: 2018-01-27 | Discharge: 2018-01-27 | Disposition: A | Discharge status: Home / Self Care | Payer: 59 | Source: Ambulatory Visit | Attending: Cardiology | Admitting: Cardiology

## 2018-01-27 ENCOUNTER — Encounter (HOSPITAL_COMMUNITY)
Admission: RE | Disposition: A | Discharge status: Home / Self Care | Payer: Self-pay | Source: Ambulatory Visit | Attending: Cardiology

## 2018-01-27 DIAGNOSIS — E785 Hyperlipidemia, unspecified: Secondary | ICD-10-CM | POA: Diagnosis not present

## 2018-01-27 DIAGNOSIS — R0600 Dyspnea, unspecified: Secondary | ICD-10-CM | POA: Diagnosis present

## 2018-01-27 DIAGNOSIS — M069 Rheumatoid arthritis, unspecified: Secondary | ICD-10-CM | POA: Insufficient documentation

## 2018-01-27 DIAGNOSIS — R0609 Other forms of dyspnea: Secondary | ICD-10-CM | POA: Diagnosis not present

## 2018-01-27 DIAGNOSIS — Z888 Allergy status to other drugs, medicaments and biological substances status: Secondary | ICD-10-CM | POA: Insufficient documentation

## 2018-01-27 DIAGNOSIS — Z79899 Other long term (current) drug therapy: Secondary | ICD-10-CM | POA: Insufficient documentation

## 2018-01-27 DIAGNOSIS — E669 Obesity, unspecified: Secondary | ICD-10-CM | POA: Diagnosis not present

## 2018-01-27 DIAGNOSIS — R0602 Shortness of breath: Secondary | ICD-10-CM | POA: Diagnosis not present

## 2018-01-27 DIAGNOSIS — I429 Cardiomyopathy, unspecified: Secondary | ICD-10-CM

## 2018-01-27 DIAGNOSIS — I10 Essential (primary) hypertension: Secondary | ICD-10-CM | POA: Insufficient documentation

## 2018-01-27 DIAGNOSIS — I5021 Acute systolic (congestive) heart failure: Secondary | ICD-10-CM | POA: Diagnosis present

## 2018-01-27 HISTORY — DX: Essential (primary) hypertension: I10

## 2018-01-27 HISTORY — PX: RIGHT/LEFT HEART CATH AND CORONARY ANGIOGRAPHY: CATH118266

## 2018-01-27 LAB — POCT I-STAT 3, ART BLOOD GAS (G3+)
ACID-BASE DEFICIT: 1 mmol/L (ref 0.0–2.0)
BICARBONATE: 23.8 mmol/L (ref 20.0–28.0)
O2 SAT: 96 %
PCO2 ART: 40.4 mmHg (ref 32.0–48.0)
PO2 ART: 80 mmHg — AB (ref 83.0–108.0)
TCO2: 25 mmol/L (ref 22–32)
pH, Arterial: 7.378 (ref 7.350–7.450)

## 2018-01-27 LAB — POCT I-STAT 3, VENOUS BLOOD GAS (G3P V)
ACID-BASE DEFICIT: 2 mmol/L (ref 0.0–2.0)
Bicarbonate: 23.4 mmol/L (ref 20.0–28.0)
O2 SAT: 72 %
PH VEN: 7.361 (ref 7.250–7.430)
TCO2: 25 mmol/L (ref 22–32)
pCO2, Ven: 41.3 mmHg — ABNORMAL LOW (ref 44.0–60.0)
pO2, Ven: 40 mmHg (ref 32.0–45.0)

## 2018-01-27 LAB — PREGNANCY, URINE: Preg Test, Ur: NEGATIVE

## 2018-01-27 SURGERY — RIGHT/LEFT HEART CATH AND CORONARY ANGIOGRAPHY
Anesthesia: LOCAL

## 2018-01-27 MED ORDER — SODIUM CHLORIDE 0.9% FLUSH: 3.0000 mL | Freq: Two times a day (BID) | INTRAVENOUS | Status: DC

## 2018-01-27 MED ORDER — FENTANYL CITRATE (PF) 100 MCG/2ML IJ SOLN
INTRAMUSCULAR | Status: DC | PRN
  Administered 2018-01-27: 25 ug via INTRAVENOUS

## 2018-01-27 MED ORDER — HEPARIN (PORCINE) IN NACL 2-0.9 UNIT/ML-% IJ SOLN
INTRAMUSCULAR | Status: AC | PRN
  Administered 2018-01-27 (×2): 500 mL

## 2018-01-27 MED ORDER — VERAPAMIL HCL 2.5 MG/ML IV SOLN
INTRAVENOUS | Status: DC | PRN
  Administered 2018-01-27: 10 mL via INTRA_ARTERIAL

## 2018-01-27 MED ORDER — MIDAZOLAM HCL 2 MG/2ML IJ SOLN
INTRAMUSCULAR | Status: DC | PRN
  Administered 2018-01-27: 1 mg via INTRAVENOUS

## 2018-01-27 MED ORDER — ONDANSETRON HCL 4 MG/2ML IJ SOLN: 4.0000 mg | Freq: Four times a day (QID) | INTRAMUSCULAR | Status: DC | PRN

## 2018-01-27 MED ORDER — MIDAZOLAM HCL 2 MG/2ML IJ SOLN
INTRAMUSCULAR | Status: AC
  Filled 2018-01-27: qty 2

## 2018-01-27 MED ORDER — HEPARIN SODIUM (PORCINE) 1000 UNIT/ML IJ SOLN
INTRAMUSCULAR | Status: DC | PRN
  Administered 2018-01-27: 5000 [IU] via INTRAVENOUS

## 2018-01-27 MED ORDER — LIDOCAINE HCL (PF) 1 % IJ SOLN
INTRAMUSCULAR | Status: AC
  Filled 2018-01-27: qty 30

## 2018-01-27 MED ORDER — FENTANYL CITRATE (PF) 100 MCG/2ML IJ SOLN
INTRAMUSCULAR | Status: AC
  Filled 2018-01-27: qty 2

## 2018-01-27 MED ORDER — IOPAMIDOL (ISOVUE-370) INJECTION 76%
INTRAVENOUS | Status: DC | PRN
  Administered 2018-01-27: 60 mL via INTRA_ARTERIAL

## 2018-01-27 MED ORDER — SODIUM CHLORIDE 0.9% FLUSH: 3.0000 mL | INTRAVENOUS | Status: DC | PRN

## 2018-01-27 MED ORDER — SODIUM CHLORIDE 0.9 % WEIGHT BASED INFUSION: 1.0000 mL/kg/h | INTRAVENOUS | Status: AC

## 2018-01-27 MED ORDER — HEPARIN (PORCINE) IN NACL 2-0.9 UNIT/ML-% IJ SOLN
INTRAMUSCULAR | Status: AC
  Filled 2018-01-27: qty 1000

## 2018-01-27 MED ORDER — SODIUM CHLORIDE 0.9 % IV SOLN: 250.0000 mL | INTRAVENOUS | Status: DC | PRN

## 2018-01-27 MED ORDER — SODIUM CHLORIDE 0.9 % IV SOLN
INTRAVENOUS | Status: DC
  Administered 2018-01-27: 12:00:00 via INTRAVENOUS

## 2018-01-27 MED ORDER — LIDOCAINE HCL (PF) 1 % IJ SOLN
INTRAMUSCULAR | Status: DC | PRN
  Administered 2018-01-27 (×2): 2 mL via SUBCUTANEOUS

## 2018-01-27 MED ORDER — ACETAMINOPHEN 325 MG PO TABS: 650.0000 mg | ORAL_TABLET | ORAL | Status: DC | PRN

## 2018-01-27 MED ORDER — VERAPAMIL HCL 2.5 MG/ML IV SOLN
INTRAVENOUS | Status: AC
  Filled 2018-01-27: qty 2

## 2018-01-27 MED ORDER — ASPIRIN 81 MG PO CHEW: 81.0000 mg | CHEWABLE_TABLET | ORAL | Status: DC

## 2018-01-27 MED ORDER — IOPAMIDOL (ISOVUE-370) INJECTION 76%
INTRAVENOUS | Status: AC
  Filled 2018-01-27: qty 100

## 2018-01-27 MED ORDER — HEPARIN SODIUM (PORCINE) 1000 UNIT/ML IJ SOLN
INTRAMUSCULAR | Status: AC
  Filled 2018-01-27: qty 1

## 2018-01-27 SURGICAL SUPPLY — 15 items
BAND CMPR LRG ZPHR (HEMOSTASIS) ×1
BAND ZEPHYR COMPRESS 30 LONG (HEMOSTASIS) ×1 IMPLANT
CATH BALLN WEDGE 5F 110CM (CATHETERS) ×1 IMPLANT
CATH INFINITI 5 FR JL3.5 (CATHETERS) ×1 IMPLANT
CATH INFINITI 5FR ANG PIGTAIL (CATHETERS) ×1 IMPLANT
CATH INFINITI JR4 5F (CATHETERS) ×1 IMPLANT
GUIDEWIRE INQWIRE 1.5J.035X260 (WIRE) IMPLANT
INQWIRE 1.5J .035X260CM (WIRE) ×2
KIT HEART LEFT (KITS) ×2 IMPLANT
PACK CARDIAC CATHETERIZATION (CUSTOM PROCEDURE TRAY) ×2 IMPLANT
SHEATH RAIN 4/5FR (SHEATH) ×1 IMPLANT
SHEATH RAIN RADIAL 21G 6FR (SHEATH) ×2 IMPLANT
SYR MEDRAD MARK V 150ML (SYRINGE) ×2 IMPLANT
TRANSDUCER W/STOPCOCK (MISCELLANEOUS) ×2 IMPLANT
TUBING CIL FLEX 10 FLL-RA (TUBING) ×2 IMPLANT

## 2018-01-27 NOTE — Interval H&P Note (Signed)
History and Physical Interval Note:  01/27/2018 12:17 PM  Jennifer Webb  has presented today for surgery, with the diagnosis of SOB, abnormal echo  The various methods of treatment have been discussed with the patient and family. After consideration of risks, benefits and other options for treatment, the patient has consented to  Procedure(s): RIGHT/LEFT HEART CATH AND CORONARY ANGIOGRAPHY (N/A) as a surgical intervention .  The patient's history has been reviewed, patient examined, no change in status, stable for surgery.  I have reviewed the patient's chart and labs.  Questions were answered to the patient's satisfaction.   Cath Lab Visit (complete for each Cath Lab visit)  Clinical Evaluation Leading to the Procedure:   ACS: No.  Non-ACS:    Anginal Classification: CCS II  Anti-ischemic medical therapy: No Therapy  Non-Invasive Test Results: No non-invasive testing performed  Prior CABG: No previous CABG        Theron Arista [facility] 01/27/2018 12:17 PM

## 2018-01-27 NOTE — Discharge Instructions (Signed)

## 2018-01-27 NOTE — Research (Signed)
CADFEM Informed Consent   Subject Name: Jennifer Webb  Subject met inclusion and exclusion criteria.  The informed consent form, study requirements and expectations were reviewed with the subject and questions and concerns were addressed prior to the signing of the consent form.  The subject verbalized understanding of the trail requirements.  The subject agreed to participate in the CADFEM trial and signed the informed consent.  The informed consent was obtained prior to performance of any protocol-specific procedures for the subject.  A copy of the signed informed consent was given to the subject and a copy was placed in the subject's medical record.  [name] 01/27/2018, 11:42 AM

## 2018-01-28 ENCOUNTER — Encounter (HOSPITAL_COMMUNITY): Payer: Self-pay | Admitting: Cardiology

## 2018-01-28 MED FILL — Heparin Sodium (Porcine) 2 Unit/ML in Sodium Chloride 0.9%: INTRAMUSCULAR | Qty: 1000 | Status: AC

## 2018-01-29 ENCOUNTER — Telehealth: Payer: Self-pay | Admitting: Cardiology

## 2018-01-29 NOTE — Telephone Encounter (Signed)
New Message  Pt calling to check if its ok for her to return to work on 02/01/2018

## 2018-01-29 NOTE — Telephone Encounter (Signed)
Route to MD for review.

## 2018-01-29 NOTE — Telephone Encounter (Signed)
Returned call to patient.Dr.Jordan advised may return to work,no lifting with right wrist for 5 days.

## 2018-02-04 DIAGNOSIS — M47817 Spondylosis without myelopathy or radiculopathy, lumbosacral region: Secondary | ICD-10-CM | POA: Diagnosis not present

## 2018-02-04 DIAGNOSIS — M545 Low back pain: Secondary | ICD-10-CM | POA: Diagnosis not present

## 2018-02-10 ENCOUNTER — Telehealth: Payer: Self-pay | Admitting: Cardiology

## 2018-02-10 NOTE — Telephone Encounter (Signed)
New Message:    Pt says she need a return to work note,needs this asap(today) please.She retunred to work on 02-01-18. When it is ready she will come to get it.

## 2018-02-10 NOTE — Telephone Encounter (Signed)
Returned call to patient.She stated she went back to work 02/01/18.Stated she needs a letter to return to work faxed to [employer].Letter faxed to Serena Croissant at fax # [phone].

## 2018-02-11 ENCOUNTER — Encounter: Payer: Self-pay | Admitting: Cardiology

## 2018-02-15 NOTE — Progress Notes (Signed)
Cardiology Office Note   Date:  02/17/2018   ID:  Jennifer Webb, DOB 1966-03-06, MRN [id number]  PCP:  Jennifer Rm, NP-C  Cardiologist:   Jennifer Georgiou [place], MD   Chief Complaint  Patient presents with  . Shortness of Breath      History of Present Illness: Jennifer Webb is a 52 y.o. female who is seen for follow up of dyspnea and abnormal Ecg. She has a history of HTN. HLD, anemia, obesity, and RA. 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 States RA in good control on Humira. Has some low back deterioration for which she gets back injections periodically. She reports a 4-6 week history of shortness of breath. This is random and not associated with exertion. No cough, edema, orthopnea. Was started on lisinopril after symptoms developed.   To further evaluate her dyspnea an Echo was done. This showed reduced EF of 40-45% with regional wall motion abnormality. This led to a right and left heart cath on 01/27/18 which was normal. Normal LV function, normal right heart pressures, and normal coronary arteries. On follow up today she is doing well. Breathing is OK. States they are planning to "burn" nerves in her back next week.     Past Medical History:  Diagnosis Date  . Allergic rhinitis 08/27/2017  . Arthritis    Rheumatiod  . GERD (gastroesophageal reflux disease) 08/27/2017  . High cholesterol   . 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 HTN (hypertension) 01/27/2018  . Iritis 08/27/2017  . Iron deficiency anemia   . Iron deficiency anemia   . Low back pain 08/27/2017  . Neutropenia ([place]) 08/27/2017  . 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 [place]   x 2  . DILATION AND CURETTAGE OF UTERUS  1986  . RIGHT/LEFT HEART CATH AND CORONARY ANGIOGRAPHY N/A 01/27/2018   Procedure: RIGHT/LEFT HEART CATH AND CORONARY ANGIOGRAPHY;  Surgeon: Webb, Jennifer Bill  M, MD;  Location: [place] [facility];  Service: Cardiovascular;  Laterality: N/A;  . 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 Adalimumab (HUMIRA) 40 MG/0.8ML PSKT Inject 0.8 mLs (40 mg total) into the skin every 14 (fourteen) days. 2 each 5  . Calcium Carbonate-Vitamin D (CALCIUM 600+D PO) Take 1 tablet by mouth daily.    . diclofenac (VOLTAREN) 75 MG EC tablet Take 75 mg by mouth 2 (two) times daily.    . diphenhydrAMINE (BENADRYL) 25 mg capsule Take 50 mg by mouth once.    Marland Kitchen ibuprofen (ADVIL,MOTRIN) 200 MG tablet Take 400 mg by mouth every 6 (six) hours as needed for headache or moderate pain.    Marland Kitchen lisinopril (PRINIVIL,ZESTRIL) 5 MG tablet Take 1 tablet (5 mg total) by mouth daily. 30 tablet 1  . Multiple Vitamin (MULTIVITAMIN) tablet Take 1 tablet by mouth daily.    . predniSONE (DELTASONE) 50 MG tablet Take 50 mg every 6 hours for 2 doses day before cath and 50 mg morning of cath 3 tablet 0   No current facility-administered medications for this visit.     Allergies:   Iodine    Social History:  The patient  reports that she has never smoked. She has never used smokeless tobacco. She reports that she drinks alcohol. She reports that she does  not use drugs.   Family History:  The patient's family history includes Diabetes in her maternal grandmother and mother; Heart attack in her maternal aunt and maternal grandmother; Hypertension in her father and mother; Lung cancer in her father; Pancreatic cancer in her brother.    ROS:  Please see the history of present illness.   Otherwise, review of systems are positive for none.   All other systems are reviewed and negative.    PHYSICAL EXAM: VS:  BP 118/82   Pulse (!) 104   Ht '5\' 6"'  (1.676 m)   Wt 232 lb 6.4 oz (105.4 kg)   BMI 37.51 kg/m  , BMI Body mass index is 37.51 kg/m. GENERAL:  Well appearing HEENT:  PERRL, EOMI, sclera are clear. Oropharynx is  clear. NECK:  No jugular venous distention, carotid upstroke brisk and symmetric, no bruits, no thyromegaly or adenopathy LUNGS:  Clear to auscultation bilaterally CHEST:  Unremarkable HEART:  RRR,  PMI not displaced or sustained,S1 and S2 within normal limits, no S3, no S4: no clicks, no rubs, no murmurs ABD:  Soft, nontender. BS +, no masses or bruits. No hepatomegaly, no splenomegaly EXT:  2 + pulses throughout, no edema, no cyanosis no clubbing. No radial site hematoma. SKIN:  Warm and dry.  No rashes NEURO:  Alert and oriented x 3. Cranial nerves II through XII intact. PSYCH:  Cognitively intact   Recent Labs: 12/09/2017: ALT 15; BNP 5.7; TSH 0.921 01/22/2018: BUN 13; Creatinine, Ser 0.68; Hemoglobin 11.1; Platelets 325; Potassium 4.5; Sodium 142    Lipid Panel No results found for: CHOL, TRIG, HDL, CHOLHDL, VLDL, LDLCALC, LDLDIRECT    Wt Readings from Last 3 Encounters:  02/17/18 232 lb 6.4 oz (105.4 kg)  01/27/18 229 lb (103.9 kg)  01/08/18 227 lb 12.8 oz (103.3 kg)      Other studies Reviewed:  Labs dated 05/14/17: cholesterol 231, triglycerides 43, HDL 77, LDL 145.   Additional studies/ records that were reviewed today include:  CHEST  2 VIEW  COMPARISON:  February 11, 2011  FINDINGS: Lungs are clear. Heart size and pulmonary vascularity are normal. No adenopathy. There is degenerative change in the thoracic spine.  IMPRESSION: No edema or consolidation.   Electronically Signed   By: Lowella Grip III M.D.   On: 12/10/2017 07:57   Echo: 01/18/18: Study Conclusions  - Left ventricle: The cavity size was normal. Wall thickness was   normal. Systolic function was mildly to moderately reduced. The   estimated ejection fraction was in the range of 40% to 45%.   Moderate hypokinesis of the mid-apicalanteroseptal and apical   myocardium. Doppler parameters are consistent with abnormal left   ventricular relaxation (grade 1 diastolic dysfunction). - Mitral  valve: There was mild regurgitation  Cardiac cath 01/27/18: Procedures   RIGHT/LEFT HEART CATH AND CORONARY ANGIOGRAPHY  Conclusion     The left ventricular systolic function is normal.  LV end diastolic pressure is normal.  The left ventricular ejection fraction is 55-65% by visual estimate.   1. Normal coronary anatomy 2. Normal LV function, EF 55% 3. Normal LV filling pressures.  4. Normal right heart pressures 5. Normal cardiac output.  Plan: cardiac findings do not correlate with Echo findings. LV function looks normal. Cannot explain her dyspnea based on findings today.       ASSESSMENT AND PLAN:  1.  Shortness of breath. I suspect this is mostly related to obesity and deconditioning. Echo suggested LV dysfunction but based on  cath data I suspect this was erroneous due to poor sound wave transmission. Cardiac cath was normal.   2. HTN. Now well controlled on low dose lisinopril.  Recommend lifestyle modification. Will follow up as needed.     Signed, Bryceson Grape Martinique, MD  02/17/2018 1:32 PM    [place] [address] [place], [place], [address] Phone [phone], Fax [phone]

## 2018-02-17 ENCOUNTER — Ambulatory Visit: Payer: 59 | Admitting: Cardiology

## 2018-02-17 ENCOUNTER — Encounter: Payer: Self-pay | Admitting: Cardiology

## 2018-02-17 VITALS — BP 118/82 | HR 104 | Ht 66.0 in | Wt 232.4 lb

## 2018-02-17 DIAGNOSIS — I10 Essential (primary) hypertension: Secondary | ICD-10-CM

## 2018-02-17 DIAGNOSIS — R0602 Shortness of breath: Secondary | ICD-10-CM

## 2018-02-17 NOTE — Patient Instructions (Signed)
Continue your current therapy   

## 2018-02-22 MED FILL — HUMIRA 40 MG/0.8ML PSKT: 40 | 28 days supply | Qty: 2 | Fill #4

## 2018-02-25 DIAGNOSIS — Z6837 Body mass index (BMI) 37.0-37.9, adult: Secondary | ICD-10-CM | POA: Diagnosis not present

## 2018-02-25 DIAGNOSIS — H209 Unspecified iridocyclitis: Secondary | ICD-10-CM | POA: Diagnosis not present

## 2018-02-25 DIAGNOSIS — E669 Obesity, unspecified: Secondary | ICD-10-CM | POA: Diagnosis not present

## 2018-02-25 DIAGNOSIS — M456 Ankylosing spondylitis lumbar region: Secondary | ICD-10-CM | POA: Diagnosis not present

## 2018-02-25 DIAGNOSIS — Z1589 Genetic susceptibility to other disease: Secondary | ICD-10-CM | POA: Diagnosis not present

## 2018-02-25 DIAGNOSIS — M2550 Pain in unspecified joint: Secondary | ICD-10-CM | POA: Diagnosis not present

## 2018-03-05 DIAGNOSIS — M545 Low back pain: Secondary | ICD-10-CM | POA: Diagnosis not present

## 2018-03-05 DIAGNOSIS — M47817 Spondylosis without myelopathy or radiculopathy, lumbosacral region: Secondary | ICD-10-CM | POA: Diagnosis not present

## 2018-03-17 MED FILL — diazePAM 5 MG TABS: 5 | 2 days supply | Qty: 2 | Fill #0

## 2018-03-19 DIAGNOSIS — M47817 Spondylosis without myelopathy or radiculopathy, lumbosacral region: Secondary | ICD-10-CM | POA: Diagnosis not present

## 2018-03-19 DIAGNOSIS — M545 Low back pain: Secondary | ICD-10-CM | POA: Diagnosis not present

## 2018-03-23 MED FILL — DICLOFENAC SODIUM 75 MG TAB: 75 | 30 days supply | Qty: 60 | Fill #1

## 2018-03-23 MED FILL — HUMIRA 40 MG/0.8ML PSKT: 40 | 28 days supply | Qty: 2 | Fill #5

## 2018-04-08 DIAGNOSIS — Z01419 Encounter for gynecological examination (general) (routine) without abnormal findings: Secondary | ICD-10-CM | POA: Diagnosis not present

## 2018-04-08 DIAGNOSIS — Z113 Encounter for screening for infections with a predominantly sexual mode of transmission: Secondary | ICD-10-CM | POA: Diagnosis not present

## 2018-04-29 DIAGNOSIS — M545 Low back pain: Secondary | ICD-10-CM | POA: Diagnosis not present

## 2018-04-29 DIAGNOSIS — M47817 Spondylosis without myelopathy or radiculopathy, lumbosacral region: Secondary | ICD-10-CM | POA: Diagnosis not present

## 2018-05-03 MED FILL — DICLOFENAC SODIUM 75 MG TAB: 75 | 30 days supply | Qty: 60 | Fill #1

## 2018-05-04 ENCOUNTER — Other Ambulatory Visit: Payer: Self-pay | Admitting: Internal Medicine

## 2018-05-04 MED ORDER — ADALIMUMAB 40 MG/0.8ML ~~LOC~~ PSKT: 0.8000 mL | PREFILLED_SYRINGE | SUBCUTANEOUS | 5 refills | Status: DC

## 2018-05-05 ENCOUNTER — Other Ambulatory Visit: Payer: Self-pay | Admitting: Internal Medicine

## 2018-05-05 MED ORDER — ADALIMUMAB 40 MG/0.4ML ~~LOC~~ AJKT: 40.0000 mg | AUTO-INJECTOR | SUBCUTANEOUS | 5 refills | Status: DC

## 2018-05-09 DIAGNOSIS — J329 Chronic sinusitis, unspecified: Secondary | ICD-10-CM | POA: Diagnosis not present

## 2018-05-09 DIAGNOSIS — B370 Candidal stomatitis: Secondary | ICD-10-CM | POA: Diagnosis not present

## 2018-05-13 DIAGNOSIS — Z30431 Encounter for routine checking of intrauterine contraceptive device: Secondary | ICD-10-CM | POA: Diagnosis not present

## 2018-05-19 DIAGNOSIS — M25562 Pain in left knee: Secondary | ICD-10-CM | POA: Diagnosis not present

## 2018-05-19 DIAGNOSIS — M456 Ankylosing spondylitis lumbar region: Secondary | ICD-10-CM | POA: Diagnosis not present

## 2018-05-19 DIAGNOSIS — Z1589 Genetic susceptibility to other disease: Secondary | ICD-10-CM | POA: Diagnosis not present

## 2018-05-19 DIAGNOSIS — Z6838 Body mass index (BMI) 38.0-38.9, adult: Secondary | ICD-10-CM | POA: Diagnosis not present

## 2018-05-19 DIAGNOSIS — H209 Unspecified iridocyclitis: Secondary | ICD-10-CM | POA: Diagnosis not present

## 2018-05-19 DIAGNOSIS — M2550 Pain in unspecified joint: Secondary | ICD-10-CM | POA: Diagnosis not present

## 2018-05-19 DIAGNOSIS — E669 Obesity, unspecified: Secondary | ICD-10-CM | POA: Diagnosis not present

## 2018-05-19 MED FILL — HUMIRA PEN 40 MG/0.4ML PNKT: 40 | 28 days supply | Qty: 2 | Fill #0

## 2018-05-21 DIAGNOSIS — M47817 Spondylosis without myelopathy or radiculopathy, lumbosacral region: Secondary | ICD-10-CM | POA: Diagnosis not present

## 2018-05-21 DIAGNOSIS — M545 Low back pain: Secondary | ICD-10-CM | POA: Diagnosis not present

## 2018-06-14 ENCOUNTER — Other Ambulatory Visit: Payer: Self-pay | Admitting: Family Medicine

## 2018-06-14 ENCOUNTER — Other Ambulatory Visit: Payer: Self-pay | Admitting: Internal Medicine

## 2018-06-14 DIAGNOSIS — M47817 Spondylosis without myelopathy or radiculopathy, lumbosacral region: Secondary | ICD-10-CM | POA: Diagnosis not present

## 2018-06-14 DIAGNOSIS — Z1231 Encounter for screening mammogram for malignant neoplasm of breast: Secondary | ICD-10-CM

## 2018-06-14 DIAGNOSIS — M5127 Other intervertebral disc displacement, lumbosacral region: Secondary | ICD-10-CM | POA: Diagnosis not present

## 2018-06-14 DIAGNOSIS — M545 Low back pain: Secondary | ICD-10-CM | POA: Diagnosis not present

## 2018-06-14 DIAGNOSIS — M25552 Pain in left hip: Secondary | ICD-10-CM | POA: Diagnosis not present

## 2018-06-17 MED FILL — HUMIRA PEN 40 MG/0.4ML PNKT: 40 | 28 days supply | Qty: 2 | Fill #1

## 2018-07-14 ENCOUNTER — Ambulatory Visit: Payer: 59

## 2018-07-23 ENCOUNTER — Other Ambulatory Visit: Payer: Self-pay | Admitting: Family Medicine

## 2018-07-23 ENCOUNTER — Other Ambulatory Visit: Payer: Self-pay | Admitting: Pharmacist

## 2018-07-23 DIAGNOSIS — I10 Essential (primary) hypertension: Secondary | ICD-10-CM

## 2018-07-23 MED ORDER — ADALIMUMAB 40 MG/0.4ML ~~LOC~~ AJKT: 40.0000 mg | AUTO-INJECTOR | SUBCUTANEOUS | 4 refills | Status: DC

## 2018-07-23 MED FILL — HUMIRA PEN 40 MG/0.4ML PNKT: 40 | 28 days supply | Qty: 2 | Fill #0

## 2018-07-23 MED FILL — DICLOFENAC SODIUM 75 MG TAB: 75 | 30 days supply | Qty: 60 | Fill #2

## 2018-07-23 MED FILL — LISINOPRIL 5 MG TABLET: 5 | 30 days supply | Qty: 30 | Fill #0

## 2018-07-23 NOTE — Telephone Encounter (Signed)
Seen cardiologist back in April. Doing well on bp med

## 2018-08-03 ENCOUNTER — Other Ambulatory Visit: Payer: Self-pay | Admitting: Pharmacist

## 2018-08-03 DIAGNOSIS — M456 Ankylosing spondylitis lumbar region: Secondary | ICD-10-CM | POA: Diagnosis not present

## 2018-08-03 DIAGNOSIS — H209 Unspecified iridocyclitis: Secondary | ICD-10-CM | POA: Diagnosis not present

## 2018-08-03 DIAGNOSIS — M25562 Pain in left knee: Secondary | ICD-10-CM | POA: Diagnosis not present

## 2018-08-03 DIAGNOSIS — Z1589 Genetic susceptibility to other disease: Secondary | ICD-10-CM | POA: Diagnosis not present

## 2018-08-03 DIAGNOSIS — E669 Obesity, unspecified: Secondary | ICD-10-CM | POA: Diagnosis not present

## 2018-08-03 DIAGNOSIS — Z6838 Body mass index (BMI) 38.0-38.9, adult: Secondary | ICD-10-CM | POA: Diagnosis not present

## 2018-08-03 DIAGNOSIS — M2550 Pain in unspecified joint: Secondary | ICD-10-CM | POA: Diagnosis not present

## 2018-08-03 MED ORDER — ADALIMUMAB 40 MG/0.4ML ~~LOC~~ AJKT: 40.0000 mg | AUTO-INJECTOR | SUBCUTANEOUS | 5 refills | Status: AC

## 2018-08-10 ENCOUNTER — Ambulatory Visit
Admission: RE | Admit: 2018-08-10 | Discharge: 2018-08-10 | Disposition: A | Discharge status: Home / Self Care | Payer: 59 | Source: Ambulatory Visit | Attending: Family Medicine | Admitting: Family Medicine

## 2018-08-10 DIAGNOSIS — Z1231 Encounter for screening mammogram for malignant neoplasm of breast: Secondary | ICD-10-CM

## 2018-08-16 DIAGNOSIS — M47817 Spondylosis without myelopathy or radiculopathy, lumbosacral region: Secondary | ICD-10-CM | POA: Diagnosis not present

## 2018-08-16 DIAGNOSIS — M7061 Trochanteric bursitis, right hip: Secondary | ICD-10-CM | POA: Diagnosis not present

## 2018-08-16 DIAGNOSIS — M545 Low back pain: Secondary | ICD-10-CM | POA: Diagnosis not present

## 2018-08-16 MED FILL — traMADol HCL 50 MG TABS: 50 | 10 days supply | Qty: 30 | Fill #0

## 2018-08-27 ENCOUNTER — Other Ambulatory Visit (INDEPENDENT_AMBULATORY_CARE_PROVIDER_SITE_OTHER): Payer: Self-pay | Admitting: Physician Assistant

## 2018-08-27 ENCOUNTER — Ambulatory Visit
Admission: RE | Admit: 2018-08-27 | Discharge: 2018-08-27 | Disposition: A | Discharge status: Home / Self Care | Payer: 59 | Source: Ambulatory Visit | Attending: Physician Assistant | Admitting: Physician Assistant

## 2018-08-27 DIAGNOSIS — M25552 Pain in left hip: Secondary | ICD-10-CM

## 2018-08-27 DIAGNOSIS — M25562 Pain in left knee: Principal | ICD-10-CM

## 2018-08-27 DIAGNOSIS — M7061 Trochanteric bursitis, right hip: Secondary | ICD-10-CM | POA: Diagnosis not present

## 2018-08-27 DIAGNOSIS — M25551 Pain in right hip: Secondary | ICD-10-CM

## 2018-08-27 DIAGNOSIS — M545 Low back pain, unspecified: Secondary | ICD-10-CM

## 2018-08-27 DIAGNOSIS — M1711 Unilateral primary osteoarthritis, right knee: Secondary | ICD-10-CM | POA: Diagnosis not present

## 2018-08-27 DIAGNOSIS — M25561 Pain in right knee: Secondary | ICD-10-CM

## 2018-08-27 DIAGNOSIS — M47816 Spondylosis without myelopathy or radiculopathy, lumbar region: Secondary | ICD-10-CM | POA: Diagnosis not present

## 2018-08-27 DIAGNOSIS — M1712 Unilateral primary osteoarthritis, left knee: Secondary | ICD-10-CM | POA: Diagnosis not present

## 2018-09-02 MED FILL — HUMIRA PEN 40 MG/0.4ML PNKT: 40 | 28 days supply | Qty: 2 | Fill #1

## 2018-09-16 DIAGNOSIS — M545 Low back pain: Secondary | ICD-10-CM | POA: Diagnosis not present

## 2018-09-16 DIAGNOSIS — M7061 Trochanteric bursitis, right hip: Secondary | ICD-10-CM | POA: Diagnosis not present

## 2018-09-16 DIAGNOSIS — M47817 Spondylosis without myelopathy or radiculopathy, lumbosacral region: Secondary | ICD-10-CM | POA: Diagnosis not present

## 2018-10-13 ENCOUNTER — Encounter: Payer: Self-pay | Admitting: Family Medicine

## 2018-10-13 ENCOUNTER — Ambulatory Visit (INDEPENDENT_AMBULATORY_CARE_PROVIDER_SITE_OTHER): Payer: 59 | Admitting: Family Medicine

## 2018-10-13 VITALS — BP 124/82 | HR 67 | Temp 98.2°F | Wt 231.4 lb

## 2018-10-13 DIAGNOSIS — M5442 Lumbago with sciatica, left side: Secondary | ICD-10-CM

## 2018-10-13 DIAGNOSIS — G8929 Other chronic pain: Secondary | ICD-10-CM | POA: Diagnosis not present

## 2018-10-13 DIAGNOSIS — M47819 Spondylosis without myelopathy or radiculopathy, site unspecified: Secondary | ICD-10-CM

## 2018-10-13 DIAGNOSIS — M5441 Lumbago with sciatica, right side: Secondary | ICD-10-CM

## 2018-10-13 NOTE — Progress Notes (Signed)
   Subjective:    Patient ID: Jennifer Webb, female    DOB: May 26, 1966, 52 y.o.   MRN: [id number]  HPI Chief Complaint  Patient presents with  . hx of back    hx of back pain, --- pain in lower back shooting down both legs.    She is here with complaints of worsening low back pain that is radiating down both legs posteriorly to her knees. States pain became worse 3 days ago while having to bend and lift at work. 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  Reports pain is bilateral low back with intermittent radiation bilaterally.  States she cannot lay on her back. Most comfortable position is on her side and a pillow between her knees.  Denies numbness or weakness. No falls.  Reports history of chronic low back pain. Denies new injury. States this is not a workman's comp issue.   Standing long periods causes her pain. Sitting is ok but sitting too long becomes painful.  Dr. Maurice Small at [facility] is her orthopedist and has been treating her for this issue.  States she had X rays and started stem cell treatment.  She has been wearing a back brace that Dr. Nettie Elm prescribed.   Reports having injections in the past with the most recent in October. She has a follow up appointment in December.  PT in July 2018.  States she had a MRI recently at [facility].    Denies fever, chills, loss of control of bowels or bladder.  Urinating as usual.   Has Tramadol at home but has not taken it this week.   Reports using heat. Taking diclofenac twice daily. Tylenol also. Back brace.   Reviewed allergies, medications, past medical, surgical, family, and social history.    Review of Systems Pertinent positives and negatives in the history of present illness.     Objective:   Physical Exam  Constitutional: She is oriented to person, place, and time. She appears well-developed and well-nourished. No distress.  HENT:  Mouth/Throat:  Oropharynx is clear and moist.  Cardiovascular: Normal rate, regular rhythm and intact distal pulses.  Pulmonary/Chest: Effort normal and breath sounds normal.  Musculoskeletal:       Cervical back: Normal.       Thoracic back: Normal.       Lumbar back: She exhibits decreased range of motion, tenderness and pain.  Normal sensation. TTP to midline lumbar spine and paraspinal muscles. Positive straight leg raise.   Neurological: She is alert and oriented to person, place, and time. She displays normal reflexes. No cranial nerve deficit or sensory deficit. She exhibits normal muscle tone.  Skin: Skin is warm and dry. No rash noted.   BP 124/82   Pulse 67   Temp 98.2 F (36.8 C) (Oral)   Wt 231 lb 6.4 oz (105 kg)   SpO2 98%   BMI 37.35 kg/m        Assessment & Plan:  Chronic bilateral low back pain with bilateral sciatica  Spondyloarthropathy  No red flag symptoms. She has been under the care of her spine specialist at [facility]. My CMA contacted their office to see if they could see her right away due to worsening pain and reported inability to do her job. She will continue with her usual treatment for now until advised of next step by her spine specialist.

## 2018-10-14 ENCOUNTER — Telehealth: Payer: Self-pay | Admitting: Internal Medicine

## 2018-10-14 DIAGNOSIS — M545 Low back pain: Secondary | ICD-10-CM | POA: Diagnosis not present

## 2018-10-14 DIAGNOSIS — M47817 Spondylosis without myelopathy or radiculopathy, lumbosacral region: Secondary | ICD-10-CM | POA: Diagnosis not present

## 2018-10-14 DIAGNOSIS — M5136 Other intervertebral disc degeneration, lumbar region: Secondary | ICD-10-CM | POA: Diagnosis not present

## 2018-10-14 NOTE — Telephone Encounter (Signed)
Pt is being seen today at 9:30am with murphy wainer. FYI

## 2018-10-15 DIAGNOSIS — M5126 Other intervertebral disc displacement, lumbar region: Secondary | ICD-10-CM | POA: Diagnosis not present

## 2018-10-15 DIAGNOSIS — M545 Low back pain: Secondary | ICD-10-CM | POA: Diagnosis not present

## 2018-10-15 DIAGNOSIS — M47817 Spondylosis without myelopathy or radiculopathy, lumbosacral region: Secondary | ICD-10-CM | POA: Diagnosis not present

## 2018-10-15 MED FILL — HUMIRA PEN 40 MG/0.4ML PNKT: 40 | 28 days supply | Qty: 2 | Fill #2

## 2018-10-18 MED FILL — LISINOPRIL 5 MG TABLET: 5 | 30 days supply | Qty: 30 | Fill #1

## 2018-10-18 MED FILL — DICLOFENAC SODIUM 75 MG TAB: 75 | 30 days supply | Qty: 60 | Fill #3

## 2018-11-08 MED FILL — HUMIRA PEN 40 MG/0.4ML PNKT: 40 | 28 days supply | Qty: 2 | Fill #3

## 2018-11-09 DIAGNOSIS — M5416 Radiculopathy, lumbar region: Secondary | ICD-10-CM | POA: Diagnosis not present

## 2018-11-09 DIAGNOSIS — M5126 Other intervertebral disc displacement, lumbar region: Secondary | ICD-10-CM | POA: Diagnosis not present

## 2018-11-09 DIAGNOSIS — M545 Low back pain: Secondary | ICD-10-CM | POA: Diagnosis not present

## 2019-01-10 DIAGNOSIS — Z0279 Encounter for issue of other medical certificate: Secondary | ICD-10-CM

## 2019-02-22 MED FILL — DICLOFENAC SODIUM 75 MG TAB: 75 | 30 days supply | Qty: 60 | Fill #0

## 2019-04-21 ENCOUNTER — Other Ambulatory Visit: Payer: Self-pay | Admitting: Family Medicine

## 2019-04-21 DIAGNOSIS — I10 Essential (primary) hypertension: Secondary | ICD-10-CM

## 2019-04-21 MED FILL — DICLOFENAC SODIUM 75 MG TAB: 75 | 30 days supply | Qty: 60 | Fill #1

## 2019-04-21 MED FILL — LISINOPRIL 5 MG TABLET: 5 | 30 days supply | Qty: 30 | Fill #0

## 2019-05-05 ENCOUNTER — Encounter: Payer: Self-pay | Admitting: Family Medicine

## 2019-05-05 ENCOUNTER — Ambulatory Visit: Payer: Medicaid Other | Admitting: Family Medicine

## 2019-05-05 ENCOUNTER — Other Ambulatory Visit: Payer: Self-pay

## 2019-05-05 VITALS — BP 130/84 | HR 91 | Temp 96.4°F | Ht 65.5 in | Wt 237.8 lb

## 2019-05-05 DIAGNOSIS — M069 Rheumatoid arthritis, unspecified: Secondary | ICD-10-CM

## 2019-05-05 DIAGNOSIS — I10 Essential (primary) hypertension: Secondary | ICD-10-CM

## 2019-05-05 DIAGNOSIS — E669 Obesity, unspecified: Secondary | ICD-10-CM

## 2019-05-05 DIAGNOSIS — E785 Hyperlipidemia, unspecified: Secondary | ICD-10-CM

## 2019-05-05 DIAGNOSIS — Z23 Encounter for immunization: Secondary | ICD-10-CM

## 2019-05-05 DIAGNOSIS — Z7189 Other specified counseling: Secondary | ICD-10-CM

## 2019-05-05 DIAGNOSIS — Z0000 Encounter for general adult medical examination without abnormal findings: Secondary | ICD-10-CM | POA: Diagnosis not present

## 2019-05-05 DIAGNOSIS — Z1159 Encounter for screening for other viral diseases: Secondary | ICD-10-CM

## 2019-05-05 DIAGNOSIS — Z7185 Encounter for immunization safety counseling: Secondary | ICD-10-CM

## 2019-05-05 DIAGNOSIS — Z114 Encounter for screening for human immunodeficiency virus [HIV]: Secondary | ICD-10-CM

## 2019-05-05 NOTE — Patient Instructions (Signed)

## 2019-05-05 NOTE — Progress Notes (Signed)
Subjective:    Patient ID: Jennifer Webb, female    DOB: 08-21-1966, 53 y.o.   MRN: [id number]  HPI Chief Complaint  Patient presents with  . cpe    cpe, sees obgyn   She is here for a complete physical exam. No concerns.   Other providers: OB/GYN- Esmond Plants  Orthopedist- Dr. Percell Miller  Rheumatologist- Dr. Lenna Gilford  GI - Dr. Collene Mares    Social history: Lives alone, divorced, she is now disabled. She may be moving to [place] to live with her daughter.  Denies smoking, drinking alcohol, drug use  Diet: nothing particular  Excerise: very little   Immunizations: would like to update her Tdap   Health maintenance:  Mammogram: 08/2017 Colonoscopy: 4 years ago at Dr. Lorie Apley office  Last Gynecological Exam: 12/2016 Last Dental Exam: overdue   Wears seatbelt always, smoke detectors in home and functioning, does not text while driving and feels safe in home environment.   Reviewed allergies, medications, past medical, surgical, family, and social history.   Review of Systems Review of Systems Constitutional: -fever, -chills, -sweats, -unexpected weight change,-fatigue ENT: -runny nose, -ear pain, -sore throat Cardiology:  -chest pain, -palpitations, -edema Respiratory: -cough, -shortness of breath, -wheezing Gastroenterology: -abdominal pain, -nausea, -vomiting, -diarrhea, -constipation  Hematology: -bleeding or bruising problems Musculoskeletal: -arthralgias, -myalgias, -joint swelling, +back pain Ophthalmology: -vision changes Urology: -dysuria, -difficulty urinating, -hematuria, -urinary frequency, -urgency Neurology: -headache, -weakness, -tingling, -numbness       Objective:   Physical Exam BP 130/84   Pulse 91   Temp (!) 96.4 F (35.8 C) (Oral)   Ht 5' 5.5" (1.664 m)   Wt 237 lb 12.8 oz (107.9 kg)   SpO2 98%   BMI 38.97 kg/m   General Appearance:    Alert, cooperative, no distress, appears stated age  Head:    Normocephalic, without obvious  abnormality, atraumatic  Eyes:    PERRL, conjunctiva/corneas clear, EOM's intact, fundi    benign  Ears:    Normal TM's and external ear canals  Nose:   Mask in place   Throat:   Mask in place   Neck:   Supple, no lymphadenopathy;  thyroid:  no   enlargement/tenderness/nodules; no carotid   bruit or JVD  Back:    Spine nontender, no curvature, ROM normal, no CVA     tenderness  Lungs:     Clear to auscultation bilaterally without wheezes, rales or     ronchi; respirations unlabored  Chest Wall:    No tenderness or deformity   Heart:    Regular rate and rhythm, S1 and S2 normal, no murmur, rub   or gallop  Breast Exam:    OB/GYN   Abdomen:     Soft, non-tender, nondistended, normoactive bowel sounds,    no masses, no hepatosplenomegaly  Genitalia:    OB/GYN     Extremities:   No clubbing, cyanosis or edema  Pulses:   2+ and symmetric all extremities  Skin:   Skin color, texture, turgor normal, no rashes or lesions  Lymph nodes:   Cervical, supraclavicular, and axillary nodes normal  Neurologic:   CNII-XII intact, normal strength, sensation and gait; reflexes 2+ and symmetric throughout          Psych:   Normal mood, affect, hygiene and grooming.         Assessment & Plan:  Routine general medical examination at a health care facility - Plan: CBC with Differential/Platelet, Comprehensive metabolic panel, TSH, T4, free, Lipid  panel, she appears to be doing well overall. She is attempting to get disability due to back pain. Discussed safety and health promotion.   Essential hypertension - Plan: close to goal range. Continue medication and low sodium diet. Continue checking BP at home.   Hyperlipidemia, unspecified hyperlipidemia type - Plan: Lipid panel counseling on low cholesterol diet   Rheumatoid arthritis, involving unspecified site, unspecified rheumatoid factor presence (HCC) - Plan: managed by Dr. Nickola Major   Need for hepatitis C screening test - Plan: Hepatitis C antibody per  guidelines   Screening for HIV without presence of risk factors - Plan: HIV Antibody (routine testing w rflx), per guidelines   Immunization counseling   Obesity (BMI 30-39.9) - Plan: TSH, T4, free, Lipid panel, counseling on healthy lifestyle   Need for diphtheria-tetanus-pertussis (Tdap) vaccine - Plan: Tdap vaccine greater than or equal to 7yo IM, counseling on all components of vaccine.

## 2019-05-07 ENCOUNTER — Other Ambulatory Visit: Payer: Self-pay | Admitting: Family Medicine

## 2019-05-07 DIAGNOSIS — E7800 Pure hypercholesterolemia, unspecified: Secondary | ICD-10-CM

## 2019-05-07 DIAGNOSIS — Z79899 Other long term (current) drug therapy: Secondary | ICD-10-CM

## 2019-05-07 LAB — COMPREHENSIVE METABOLIC PANEL
ALT: 28 IU/L (ref 0–32)
AST: 32 IU/L (ref 0–40)
Albumin/Globulin Ratio: 1.6 (ref 1.2–2.2)
Albumin: 4.5 g/dL (ref 3.8–4.9)
Alkaline Phosphatase: 60 IU/L (ref 39–117)
BUN/Creatinine Ratio: 21 (ref 9–23)
BUN: 15 mg/dL (ref 6–24)
Bilirubin Total: 0.2 mg/dL (ref 0.0–1.2)
CO2: 22 mmol/L (ref 20–29)
Calcium: 9.9 mg/dL (ref 8.7–10.2)
Chloride: 103 mmol/L (ref 96–106)
Creatinine, Ser: 0.72 mg/dL (ref 0.57–1.00)
GFR calc Af Amer: 110 mL/min/{1.73_m2} (ref >59)
GFR calc non Af Amer: 95 mL/min/{1.73_m2} (ref >59)
Globulin, Total: 2.8 g/dL (ref 1.5–4.5)
Glucose: 90 mg/dL (ref 65–99)
Potassium: 4.3 mmol/L (ref 3.5–5.2)
Sodium: 141 mmol/L (ref 134–144)
Total Protein: 7.3 g/dL (ref 6.0–8.5)

## 2019-05-07 LAB — TSH: TSH: 1.41 u[IU]/mL (ref 0.450–4.500)

## 2019-05-07 LAB — CBC WITH DIFFERENTIAL/PLATELET
Basophils Absolute: 0 10*3/uL (ref 0.0–0.2)
Basos: 1 %
EOS (ABSOLUTE): 0.1 10*3/uL (ref 0.0–0.4)
Eos: 2 %
Hematocrit: 36.5 % (ref 34.0–46.6)
Hemoglobin: 11.8 g/dL (ref 11.1–15.9)
Immature Grans (Abs): 0 10*3/uL (ref 0.0–0.1)
Immature Granulocytes: 0 %
Lymphocytes Absolute: 2.4 10*3/uL (ref 0.7–3.1)
Lymphs: 46 %
MCH: 25.8 pg — ABNORMAL LOW (ref 26.6–33.0)
MCHC: 32.3 g/dL (ref 31.5–35.7)
MCV: 80 fL (ref 79–97)
Monocytes Absolute: 0.7 10*3/uL (ref 0.1–0.9)
Monocytes: 14 %
Neutrophils Absolute: 1.9 10*3/uL (ref 1.4–7.0)
Neutrophils: 37 %
Platelets: 359 10*3/uL (ref 150–450)
RBC: 4.58 x10E6/uL (ref 3.77–5.28)
RDW: 14.6 % (ref 11.7–15.4)
WBC: 5.2 10*3/uL (ref 3.4–10.8)

## 2019-05-07 LAB — HIV ANTIBODY (ROUTINE TESTING W REFLEX): HIV Screen 4th Generation wRfx: NONREACTIVE

## 2019-05-07 LAB — LIPID PANEL
Chol/HDL Ratio: 4 ratio (ref 0.0–4.4)
Cholesterol, Total: 293 mg/dL — ABNORMAL HIGH (ref 100–199)
HDL: 73 mg/dL (ref >39)
LDL Calculated: 198 mg/dL — ABNORMAL HIGH (ref 0–99)
Triglycerides: 109 mg/dL (ref 0–149)
VLDL Cholesterol Cal: 22 mg/dL (ref 5–40)

## 2019-05-07 LAB — T4, FREE: Free T4: 1 ng/dL (ref 0.82–1.77)

## 2019-05-07 LAB — HEPATITIS C ANTIBODY: Hep C Virus Ab: <0.1 s/co ratio (ref 0.0–0.9)

## 2019-05-07 MED ORDER — ATORVASTATIN CALCIUM 20 MG PO TABS: 20.0000 mg | ORAL_TABLET | Freq: Every day | ORAL | 2 refills | Status: DC

## 2019-05-09 ENCOUNTER — Other Ambulatory Visit: Payer: Self-pay | Admitting: Internal Medicine

## 2019-05-09 DIAGNOSIS — Z30432 Encounter for removal of intrauterine contraceptive device: Secondary | ICD-10-CM

## 2019-05-09 MED FILL — ATORVASTATIN 20 MG TABLET: 20 | 30 days supply | Qty: 30 | Fill #0

## 2019-05-09 MED FILL — CYCLOBENZAPRINE 10 MG TAB: 10 | 30 days supply | Qty: 30 | Fill #0

## 2019-06-07 MED FILL — DICLOFENAC SODIUM 75 MG TAB: 75 | 30 days supply | Qty: 60 | Fill #0

## 2019-06-07 MED FILL — ATORVASTATIN 20 MG TABLET: 20 | 30 days supply | Qty: 30 | Fill #1

## 2019-06-08 MED FILL — miSOPROStol 200 MCG TABS: 200 | 1 days supply | Qty: 2 | Fill #0

## 2019-06-16 ENCOUNTER — Other Ambulatory Visit: Payer: Self-pay

## 2019-06-16 ENCOUNTER — Ambulatory Visit (INDEPENDENT_AMBULATORY_CARE_PROVIDER_SITE_OTHER): Payer: Medicaid Other | Admitting: Family Medicine

## 2019-06-16 ENCOUNTER — Encounter: Payer: Self-pay | Admitting: Family Medicine

## 2019-06-16 VITALS — BP 130/88 | HR 85 | Temp 98.4°F | Ht 66.0 in | Wt 224.8 lb

## 2019-06-16 DIAGNOSIS — Z79899 Other long term (current) drug therapy: Secondary | ICD-10-CM | POA: Diagnosis not present

## 2019-06-16 DIAGNOSIS — I10 Essential (primary) hypertension: Secondary | ICD-10-CM | POA: Diagnosis not present

## 2019-06-16 DIAGNOSIS — E7800 Pure hypercholesterolemia, unspecified: Secondary | ICD-10-CM | POA: Diagnosis not present

## 2019-06-16 NOTE — Progress Notes (Signed)
   Subjective:    Patient ID: Jennifer Webb, female    DOB: 02-Apr-1966, 53 y.o.   MRN: [id number]  HPI Chief Complaint  Patient presents with  . Follow-up    recheck labs    Here today for fasting labs and recheck of lipids. Started her on a statin one month ago. She reports doing well on the medication. No side effects.   Did not take her BP medication today.   No new concerns.     Review of Systems Pertinent positives and negatives in the history of present illness.     Objective:   Physical Exam BP 130/88   Pulse 85   Temp 98.4 F (36.9 C)   Ht 5\' 6"  (1.676 m)   Wt 224 lb 12.8 oz (102 kg)   SpO2 96%   BMI 36.28 kg/m   Alert and oriented and in no acute distress.  Not otherwise examined.      Assessment & Plan:  Pure hypercholesterolemia - Plan: Lipid panel, Comprehensive metabolic panel, discussed that she is on atorvastatin for primary prevention due to significantly elevated LDL.  She reports good compliance with medication no side effects.  Counseling on low-cholesterol, low-fat diet.  Essential hypertension - Plan: She is aware that her blood pressure is elevated slightly today.  She did not take her medication today.  She will keep an eye on her blood pressure at home and follow-up if it is not in goal range.  Medication management - Plan: Lipid panel, Comprehensive metabolic panel, adjust medications according to lab results

## 2019-06-16 NOTE — Patient Instructions (Addendum)
Keep an eye on your BP at home. If your BP reading is not consistently less than 130/80, let me know.   Eat a low salt diet.      DASH Eating Plan DASH stands for "Dietary Approaches to Stop Hypertension." The DASH eating plan is a healthy eating plan that has been shown to reduce high blood pressure (hypertension). It may also reduce your risk for type 2 diabetes, heart disease, and stroke. The DASH eating plan may also help with weight loss. What are tips for following this plan?  General guidelines  Avoid eating more than 2,300 mg (milligrams) of salt (sodium) a day. If you have hypertension, you may need to reduce your sodium intake to 1,500 mg a day.  Limit alcohol intake to no more than 1 drink a day for nonpregnant women and 2 drinks a day for men. One drink equals 12 oz of beer, 5 oz of wine, or 1 oz of hard liquor.  Work with your health care provider to maintain a healthy body weight or to lose weight. Ask what an ideal weight is for you.  Get at least 30 minutes of exercise that causes your heart to beat faster (aerobic exercise) most days of the week. Activities may include walking, swimming, or biking.  Work with your health care provider or diet and nutrition specialist (dietitian) to adjust your eating plan to your individual calorie needs. Reading food labels   Check food labels for the amount of sodium per serving. Choose foods with less than 5 percent of the Daily Value of sodium. Generally, foods with less than 300 mg of sodium per serving fit into this eating plan.  To find whole grains, look for the word "whole" as the first word in the ingredient list. Shopping  Buy products labeled as "low-sodium" or "no salt added."  Buy fresh foods. Avoid canned foods and premade or frozen meals. Cooking  Avoid adding salt when cooking. Use salt-free seasonings or herbs instead of table salt or sea salt. Check with your health care provider or pharmacist before using salt  substitutes.  Do not fry foods. Cook foods using healthy methods such as baking, boiling, grilling, and broiling instead.  Cook with heart-healthy oils, such as olive, canola, soybean, or sunflower oil. Meal planning  Eat a balanced diet that includes: ? 5 or more servings of fruits and vegetables each day. At each meal, try to fill half of your plate with fruits and vegetables. ? Up to 6-8 servings of whole grains each day. ? Less than 6 oz of lean meat, poultry, or fish each day. A 3-oz serving of meat is about the same size as a deck of cards. One egg equals 1 oz. ? 2 servings of low-fat dairy each day. ? A serving of nuts, seeds, or beans 5 times each week. ? Heart-healthy fats. Healthy fats called Omega-3 fatty acids are found in foods such as flaxseeds and coldwater fish, like sardines, salmon, and mackerel.  Limit how much you eat of the following: ? Canned or prepackaged foods. ? Food that is high in trans fat, such as fried foods. ? Food that is high in saturated fat, such as fatty meat. ? Sweets, desserts, sugary drinks, and other foods with added sugar. ? Full-fat dairy products.  Do not salt foods before eating.  Try to eat at least 2 vegetarian meals each week.  Eat more home-cooked food and less restaurant, buffet, and fast food.  When eating at a  restaurant, ask that your food be prepared with less salt or no salt, if possible. What foods are recommended? The items listed may not be a complete list. Talk with your dietitian about what dietary choices are best for you. Grains Whole-grain or whole-wheat bread. Whole-grain or whole-wheat pasta. Brown rice. Orpah Cobb. Bulgur. Whole-grain and low-sodium cereals. Pita bread. Low-fat, low-sodium crackers. Whole-wheat flour tortillas. Vegetables Fresh or frozen vegetables (raw, steamed, roasted, or grilled). Low-sodium or reduced-sodium tomato and vegetable juice. Low-sodium or reduced-sodium tomato sauce and tomato  paste. Low-sodium or reduced-sodium canned vegetables. Fruits All fresh, dried, or frozen fruit. Canned fruit in natural juice (without added sugar). Meat and other protein foods Skinless chicken or [place]. Ground chicken or [place]. Pork with fat trimmed off. Fish and seafood. Egg whites. Dried beans, peas, or lentils. Unsalted nuts, nut butters, and seeds. Unsalted canned beans. Lean cuts of beef with fat trimmed off. Low-sodium, lean deli meat. Dairy Low-fat (1%) or fat-free (skim) milk. Fat-free, low-fat, or reduced-fat cheeses. Nonfat, low-sodium ricotta or cottage cheese. Low-fat or nonfat yogurt. Low-fat, low-sodium cheese. Fats and oils Soft margarine without trans fats. Vegetable oil. Low-fat, reduced-fat, or light mayonnaise and salad dressings (reduced-sodium). Canola, safflower, olive, soybean, and sunflower oils. Avocado. Seasoning and other foods Herbs. Spices. Seasoning mixes without salt. Unsalted popcorn and pretzels. Fat-free sweets. What foods are not recommended? The items listed may not be a complete list. Talk with your dietitian about what dietary choices are best for you. Grains Baked goods made with fat, such as croissants, muffins, or some breads. Dry pasta or rice meal packs. Vegetables Creamed or fried vegetables. Vegetables in a cheese sauce. Regular canned vegetables (not low-sodium or reduced-sodium). Regular canned tomato sauce and paste (not low-sodium or reduced-sodium). Regular tomato and vegetable juice (not low-sodium or reduced-sodium). Rosita Fire. Olives. Fruits Canned fruit in a light or heavy syrup. Fried fruit. Fruit in cream or butter sauce. Meat and other protein foods Fatty cuts of meat. Ribs. Fried meat. Tomasa Blase. Sausage. Bologna and other processed lunch meats. Salami. Fatback. Hotdogs. Bratwurst. Salted nuts and seeds. Canned beans with added salt. Canned or smoked fish. Whole eggs or egg yolks. Chicken or [place] with skin. Dairy Whole or 2% milk,  cream, and half-and-half. Whole or full-fat cream cheese. Whole-fat or sweetened yogurt. Full-fat cheese. Nondairy creamers. Whipped toppings. Processed cheese and cheese spreads. Fats and oils Butter. Stick margarine. Lard. Shortening. Ghee. Bacon fat. Tropical oils, such as coconut, palm kernel, or palm oil. Seasoning and other foods Salted popcorn and pretzels. Onion salt, garlic salt, seasoned salt, table salt, and sea salt. Worcestershire sauce. Tartar sauce. Barbecue sauce. Teriyaki sauce. Soy sauce, including reduced-sodium. Steak sauce. Canned and packaged gravies. Fish sauce. Oyster sauce. Cocktail sauce. Horseradish that you find on the shelf. Ketchup. Mustard. Meat flavorings and tenderizers. Bouillon cubes. Hot sauce and Tabasco sauce. Premade or packaged marinades. Premade or packaged taco seasonings. Relishes. Regular salad dressings. Where to find more information:  National Heart, Lung, and Blood Institute: PopSteam.is  American Heart Association: www.heart.org Summary  The DASH eating plan is a healthy eating plan that has been shown to reduce high blood pressure (hypertension). It may also reduce your risk for type 2 diabetes, heart disease, and stroke.  With the DASH eating plan, you should limit salt (sodium) intake to 2,300 mg a day. If you have hypertension, you may need to reduce your sodium intake to 1,500 mg a day.  When on the DASH eating plan, aim to eat more  fresh fruits and vegetables, whole grains, lean proteins, low-fat dairy, and heart-healthy fats.  Work with your health care provider or diet and nutrition specialist (dietitian) to adjust your eating plan to your individual calorie needs. This information is not intended to replace advice given to you by your health care provider. Make sure you discuss any questions you have with your health care provider. Document Released: 10/09/2011 Document Revised: 10/02/2017 Document Reviewed: 10/13/2016 Elsevier  Patient Education  2020 [employer].

## 2019-06-17 LAB — COMPREHENSIVE METABOLIC PANEL
ALT: 16 IU/L (ref 0–32)
AST: 23 IU/L (ref 0–40)
Albumin/Globulin Ratio: 1.4 (ref 1.2–2.2)
Albumin: 4.2 g/dL (ref 3.8–4.9)
Alkaline Phosphatase: 69 IU/L (ref 39–117)
BUN/Creatinine Ratio: 11 (ref 9–23)
BUN: 9 mg/dL (ref 6–24)
Bilirubin Total: 0.4 mg/dL (ref 0.0–1.2)
CO2: 26 mmol/L (ref 20–29)
Calcium: 9.5 mg/dL (ref 8.7–10.2)
Chloride: 104 mmol/L (ref 96–106)
Creatinine, Ser: 0.8 mg/dL (ref 0.57–1.00)
GFR calc Af Amer: 97 mL/min/{1.73_m2} (ref >59)
GFR calc non Af Amer: 84 mL/min/{1.73_m2} (ref >59)
Globulin, Total: 3 g/dL (ref 1.5–4.5)
Glucose: 80 mg/dL (ref 65–99)
Potassium: 4.8 mmol/L (ref 3.5–5.2)
Sodium: 143 mmol/L (ref 134–144)
Total Protein: 7.2 g/dL (ref 6.0–8.5)

## 2019-06-17 LAB — LIPID PANEL
Chol/HDL Ratio: 2.3 ratio (ref 0.0–4.4)
Cholesterol, Total: 167 mg/dL (ref 100–199)
HDL: 72 mg/dL (ref >39)
LDL Calculated: 85 mg/dL (ref 0–99)
Triglycerides: 51 mg/dL (ref 0–149)
VLDL Cholesterol Cal: 10 mg/dL (ref 5–40)

## 2019-06-21 MED FILL — LISINOPRIL 5 MG TAB: 5 | 30 days supply | Qty: 30 | Fill #0

## 2019-06-27 MED FILL — GABAPENTIN 300 MG CAPSULE: 300 | 30 days supply | Qty: 30 | Fill #0

## 2019-07-07 ENCOUNTER — Encounter: Payer: Self-pay | Admitting: Internal Medicine

## 2019-07-07 ENCOUNTER — Telehealth: Payer: Self-pay | Admitting: Family Medicine

## 2019-07-07 NOTE — Telephone Encounter (Signed)
Requested records received from [facility]

## 2019-07-08 MED FILL — ATORVASTATIN 20 MG TABLET: 20 | 30 days supply | Qty: 30 | Fill #2

## 2019-07-13 ENCOUNTER — Encounter: Payer: Self-pay | Admitting: Family Medicine

## 2019-07-20 ENCOUNTER — Other Ambulatory Visit: Payer: Medicaid Other

## 2019-07-26 MED FILL — GABAPENTIN 300 MG CAPSULE: 300 | 30 days supply | Qty: 30 | Fill #1

## 2019-07-26 MED FILL — DICLOFENAC SODIUM 75 MG TAB: 75 | 30 days supply | Qty: 60 | Fill #1

## 2019-08-18 ENCOUNTER — Other Ambulatory Visit: Payer: Self-pay | Admitting: Family Medicine

## 2019-08-18 DIAGNOSIS — E7800 Pure hypercholesterolemia, unspecified: Secondary | ICD-10-CM

## 2019-08-19 MED FILL — ATORVASTATIN 20 MG TABLET: 20 | 30 days supply | Qty: 30 | Fill #0

## 2020-01-11 MED FILL — ATORVASTATIN 20 MG TABLET: 20 | 90 days supply | Qty: 90 | Fill #0

## 2020-01-11 MED FILL — LISINOPRIL 5 MG TABS: 5 | 90 days supply | Qty: 90 | Fill #0

## 2020-01-11 MED FILL — DICLOFENAC SOD EC 75 MG TAB: 75 | 90 days supply | Qty: 180 | Fill #0

## 2020-01-11 MED FILL — GABAPENTIN 300 MG CAPSULE: 300 | 90 days supply | Qty: 90 | Fill #0

## 2020-01-11 MED FILL — CYCLOBENZAPRINE HCL 10 MG T: 10 | 90 days supply | Qty: 90 | Fill #0

## 2021-08-21 ENCOUNTER — Encounter: Payer: Self-pay | Admitting: Family Medicine

## 2022-09-17 ENCOUNTER — Encounter: Payer: Self-pay | Admitting: Internal Medicine
